# Patient Record
Sex: Female | Born: 1990 | Race: White | Hispanic: No | Marital: Married | Smoking: Never smoker
Health system: Southern US, Community
[De-identification: ages and names within clinical notes are randomized; demographics above are authoritative.]

## PROBLEM LIST (undated history)

## (undated) ENCOUNTER — Inpatient Hospital Stay (HOSPITAL_COMMUNITY): Payer: Self-pay

## (undated) DIAGNOSIS — D649 Anemia, unspecified: Secondary | ICD-10-CM

## (undated) HISTORY — PX: WISDOM TOOTH EXTRACTION: SHX21

---
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
## 2009-03-14 ENCOUNTER — Other Ambulatory Visit: Admission: RE | Admit: 2009-03-14 | Discharge: 2009-03-14 | Payer: Self-pay | Admitting: Obstetrics and Gynecology

## 2010-04-03 ENCOUNTER — Other Ambulatory Visit (HOSPITAL_COMMUNITY)
Admission: RE | Admit: 2010-04-03 | Discharge: 2010-04-03 | Disposition: A | Discharge status: Home / Self Care | Payer: 59 | Source: Ambulatory Visit | Attending: Obstetrics and Gynecology | Admitting: Obstetrics and Gynecology

## 2010-04-03 ENCOUNTER — Other Ambulatory Visit: Payer: Self-pay | Admitting: Nurse Practitioner

## 2010-04-03 DIAGNOSIS — Z113 Encounter for screening for infections with a predominantly sexual mode of transmission: Secondary | ICD-10-CM | POA: Insufficient documentation

## 2010-04-03 DIAGNOSIS — Z01419 Encounter for gynecological examination (general) (routine) without abnormal findings: Secondary | ICD-10-CM | POA: Insufficient documentation

## 2010-06-09 LAB — URINALYSIS, ROUTINE W REFLEX MICROSCOPIC
Bilirubin Urine: NEGATIVE
Ketones, ur: NEGATIVE mg/dL
Nitrite: NEGATIVE
Protein, ur: NEGATIVE mg/dL
Urobilinogen, UA: 0.2 mg/dL (ref 0.0–1.0)

## 2010-06-09 LAB — PREGNANCY, URINE: Preg Test, Ur: NEGATIVE

## 2010-06-09 LAB — GLUCOSE, CAPILLARY: Glucose-Capillary: 88 mg/dL (ref 70–99)

## 2011-04-07 ENCOUNTER — Other Ambulatory Visit: Payer: Self-pay | Admitting: Nurse Practitioner

## 2011-04-07 ENCOUNTER — Other Ambulatory Visit (HOSPITAL_COMMUNITY)
Admission: RE | Admit: 2011-04-07 | Discharge: 2011-04-07 | Disposition: A | Discharge status: Home / Self Care | Payer: 59 | Source: Ambulatory Visit | Attending: Obstetrics and Gynecology | Admitting: Obstetrics and Gynecology

## 2011-04-07 DIAGNOSIS — Z01419 Encounter for gynecological examination (general) (routine) without abnormal findings: Secondary | ICD-10-CM | POA: Insufficient documentation

## 2011-04-07 DIAGNOSIS — Z113 Encounter for screening for infections with a predominantly sexual mode of transmission: Secondary | ICD-10-CM | POA: Insufficient documentation

## 2012-05-18 ENCOUNTER — Other Ambulatory Visit: Payer: Self-pay | Admitting: Nurse Practitioner

## 2012-05-18 ENCOUNTER — Other Ambulatory Visit (HOSPITAL_COMMUNITY)
Admission: RE | Admit: 2012-05-18 | Discharge: 2012-05-18 | Disposition: A | Discharge status: Home / Self Care | Payer: 59 | Source: Ambulatory Visit | Attending: Obstetrics and Gynecology | Admitting: Obstetrics and Gynecology

## 2012-05-18 DIAGNOSIS — Z113 Encounter for screening for infections with a predominantly sexual mode of transmission: Secondary | ICD-10-CM | POA: Insufficient documentation

## 2012-05-18 DIAGNOSIS — Z01419 Encounter for gynecological examination (general) (routine) without abnormal findings: Secondary | ICD-10-CM | POA: Insufficient documentation

## 2013-08-04 LAB — OB RESULTS CONSOLE GC/CHLAMYDIA
Chlamydia: NEGATIVE
GC PROBE AMP, GENITAL: NEGATIVE

## 2013-08-04 LAB — OB RESULTS CONSOLE ANTIBODY SCREEN: ANTIBODY SCREEN: NEGATIVE

## 2013-08-04 LAB — OB RESULTS CONSOLE HIV ANTIBODY (ROUTINE TESTING): HIV: NONREACTIVE

## 2013-08-04 LAB — OB RESULTS CONSOLE ABO/RH: RH TYPE: POSITIVE

## 2013-08-04 LAB — OB RESULTS CONSOLE HEPATITIS B SURFACE ANTIGEN: HEP B S AG: NEGATIVE

## 2013-08-04 LAB — OB RESULTS CONSOLE RUBELLA ANTIBODY, IGM: RUBELLA: IMMUNE

## 2013-08-04 LAB — OB RESULTS CONSOLE RPR: RPR: NONREACTIVE

## 2013-08-14 ENCOUNTER — Other Ambulatory Visit: Payer: Self-pay | Admitting: Obstetrics & Gynecology

## 2013-08-14 ENCOUNTER — Other Ambulatory Visit (HOSPITAL_COMMUNITY)
Admission: RE | Admit: 2013-08-14 | Discharge: 2013-08-14 | Disposition: A | Discharge status: Home / Self Care | Payer: 59 | Source: Ambulatory Visit | Attending: Obstetrics & Gynecology | Admitting: Obstetrics & Gynecology

## 2013-08-14 DIAGNOSIS — Z01419 Encounter for gynecological examination (general) (routine) without abnormal findings: Secondary | ICD-10-CM | POA: Insufficient documentation

## 2013-08-14 DIAGNOSIS — Z113 Encounter for screening for infections with a predominantly sexual mode of transmission: Secondary | ICD-10-CM | POA: Insufficient documentation

## 2013-08-16 LAB — CYTOLOGY - PAP

## 2014-02-12 LAB — OB RESULTS CONSOLE GBS: GBS: NEGATIVE

## 2014-03-02 NOTE — L&D Delivery Note (Signed)
23yo G1P0@ 3529w0d who present for IOL due to postdates.  Pregnancy has been uncomplicated to date.    Induction was started with cytotec and proceeded with foley balloon and Pitocin.  She received an epidural for pain management.  She spontaneously ruptured and progressed to complete dilation. At 4:21 PM a viable female was delivered via Vaginal, Spontaneous Delivery (Presentation: Left Occiput Anterior).  APGAR: 9, 9; weight 7 lb 9.3 oz (3439 g).   Placenta status: Intact, Spontaneous.  Cord: 3 vessels with the following complications: None.  Anesthesia: Epidural  Episiotomy: None Lacerations:  Right labial Suture Repair: 3.0 vicryl and 4-0 monocryl Est. Blood Loss (mL): 300  Mom to postpartum.  Baby to Couplet care / Skin to Skin.  Myna HidalgoZAN, Ilijah Doucet, M 04/03/2014, 6:57 PM

## 2014-03-28 ENCOUNTER — Encounter (HOSPITAL_COMMUNITY): Payer: Self-pay | Admitting: *Deleted

## 2014-03-28 ENCOUNTER — Telehealth (HOSPITAL_COMMUNITY): Payer: Self-pay | Admitting: *Deleted

## 2014-03-28 NOTE — Telephone Encounter (Signed)
Preadmission screen  

## 2014-03-30 ENCOUNTER — Encounter (HOSPITAL_COMMUNITY): Payer: Self-pay | Admitting: *Deleted

## 2014-03-30 ENCOUNTER — Inpatient Hospital Stay (HOSPITAL_COMMUNITY)
Admission: AD | Admit: 2014-03-30 | Discharge: 2014-03-30 | Disposition: A | Discharge status: Home / Self Care | Payer: 59 | Source: Ambulatory Visit | Attending: Obstetrics and Gynecology | Admitting: Obstetrics and Gynecology

## 2014-03-30 DIAGNOSIS — O4693 Antepartum hemorrhage, unspecified, third trimester: Secondary | ICD-10-CM

## 2014-03-30 DIAGNOSIS — Z3A40 40 weeks gestation of pregnancy: Secondary | ICD-10-CM | POA: Insufficient documentation

## 2014-03-30 HISTORY — DX: Anemia, unspecified: D64.9

## 2014-03-30 NOTE — Discharge Instructions (Signed)
Vaginal Bleeding During Pregnancy, Third Trimester °A small amount of bleeding (spotting) from the vagina is relatively common in pregnancy. Various things can cause bleeding or spotting in pregnancy. Sometimes the bleeding is normal and is not a problem. However, bleeding during the third trimester can also be a sign of something serious for the mother and the baby. Be sure to tell your health care provider about any vaginal bleeding right away.  °Some possible causes of vaginal bleeding during the third trimester include:  °· The placenta may be partially or completely covering the opening to the cervix (placenta previa).   °· The placenta may have separated from the uterus (abruption of the placenta).   °· There may be an infection or growth on the cervix.   °· You may be starting labor, called discharging of the mucus plug.   °· The placenta may grow into the muscle layer of the uterus (placenta accreta).   °HOME CARE INSTRUCTIONS  °Watch your condition for any changes. The following actions may help to lessen any discomfort you are feeling:  °· Follow your health care provider's instructions for limiting your activity. If your health care provider orders bed rest, you may need to stay in bed and only get up to use the bathroom. However, your health care provider may allow you to continue light activity. °· If needed, make plans for someone to help with your regular activities and responsibilities while you are on bed rest. °· Keep track of the number of pads you use each day, how often you change pads, and how soaked (saturated) they are. Write this down. °· Do not use tampons. Do not douche. °· Do not have sexual intercourse or orgasms until approved by your health care provider. °· Follow your health care provider's advice about lifting, driving, and physical activities. °· If you pass any tissue from your vagina, save the tissue so you can show it to your health care provider.   °· Only take over-the-counter  or prescription medicines as directed by your health care provider. °· Do not take aspirin because it can make you bleed.   °· Keep all follow-up appointments as directed by your health care provider. °SEEK MEDICAL CARE IF: °· You have any vaginal bleeding during any part of your pregnancy. °· You have cramps or labor pains. °· You have a fever, not controlled by medicine. °SEEK IMMEDIATE MEDICAL CARE IF:  °· You have severe cramps or pain in your back or belly (abdomen). °· You have chills. °· You have a gush of fluid from the vagina. °· You pass large clots or tissue from your vagina. °· Your bleeding increases. °· You feel light-headed or weak. °· You pass out. °· You feel less movement or no movement of the baby.   °MAKE SURE YOU: °· Understand these instructions. °· Will watch your condition. °· Will get help right away if you are not doing well or get worse. °Document Released: 05/09/2002 Document Revised: 02/21/2013 Document Reviewed: 10/24/2012 °ExitCare® Patient Information ©2015 [employer]. This information is not intended to replace advice given to you by your health care provider. Make sure you discuss any questions you have with your health care provider. ° °Pelvic Rest °Pelvic rest is sometimes recommended for women when:  °· The placenta is partially or completely covering the opening of the cervix (placenta previa). °· There is bleeding between the uterine wall and the amniotic sac in the first trimester (subchorionic hemorrhage). °· The cervix begins to open without labor starting (incompetent cervix, cervical   insufficiency). °· The labor is too early (preterm labor). °HOME CARE INSTRUCTIONS °· Do not have sexual intercourse, stimulation, or an orgasm. °· Do not use tampons, douche, or put anything in the vagina. °· Do not lift anything over 10 pounds (4.5 kg). °· Avoid strenuous activity or straining your pelvic muscles. °SEEK MEDICAL CARE IF:  °· You have any vaginal bleeding during pregnancy.  Treat this as a potential emergency. °· You have cramping pain felt low in the stomach (stronger than menstrual cramps). °· You notice vaginal discharge (watery, mucus, or bloody). °· You have a low, dull backache. °· There are regular contractions or uterine tightening. °SEEK IMMEDIATE MEDICAL CARE IF: °You have vaginal bleeding and have placenta previa.  °Document Released: 06/13/2010 Document Revised: 05/11/2011 Document Reviewed: 06/13/2010 °ExitCare® Patient Information ©2015 [employer]. This information is not intended to replace advice given to you by your health care provider. Make sure you discuss any questions you have with your health care provider. ° °

## 2014-03-30 NOTE — MAU Note (Signed)
PT  SAYS SHE STARTED  VAG BLEEDING -   AT 0030.    LAST  SEX-  0030.  IN TRIAGE-   PAD ON -   LIGHT    BROWN    D/C .     ALSO    HAS UC -  STARTED   AT 930PM.   VE IN OFFICE LAST WEEK-  CLOSED.    DENIES HSV AND MRSA    GBS- NEG.

## 2014-03-30 NOTE — MAU Provider Note (Signed)
History     CSN: [id number]  Arrival date and time: 03/30/14 1324   First Provider Initiated Contact with Patient 03/30/14 0208      No chief complaint on file. vaginal bleeding  HPI Tamara [name] 24 y.o. G1P0  presents to MAU complaining of vaginal bleeding immediately following sex.  Her husband noticed the bleeding.  She went to the bathroom and kept wiping repeatedly to clean it all away.  It was noted to be bright red.  She has bled very little since then.  She is having some contractions now, worse than usual.   She is also feeling good fetal movement.  She denies LOF, dysuria, weakness, fever.    OB History    Gravida Para Term Preterm AB TAB SAB Ectopic Multiple Living   1               Past Medical History  Diagnosis Date  . Anemia     Past Surgical History  Procedure Laterality Date  . Wisdom tooth extraction      History reviewed. No pertinent family history.  History  Substance Use Topics  . Smoking status: Never Smoker   . Smokeless tobacco: Not on file  . Alcohol Use: Yes     Comment: not while pregnant    Allergies: No Known Allergies  Prescriptions prior to admission  Medication Sig Dispense Refill Last Dose  . ferrous fumarate (HEMOCYTE - 106 MG FE) 325 (106 FE) MG TABS tablet Take 1 tablet by mouth.   Past Month at Unknown time  . prenatal vitamin w/FE, FA (PRENATAL 1 + 1) 27-1 MG TABS tablet Take 1 tablet by mouth daily at 12 noon.   Past Week at Unknown time    ROS Pertinent ROS in HPI  Physical Exam   Blood pressure 138/95, pulse 104, temperature 98.4 F (36.9 C), temperature source Oral, resp. rate 20, height  (1.702 m), weight 152 lb 8 oz (69.174 kg), last menstrual period 06/20/2013.  Physical Exam  Constitutional: She is oriented to person, place, and time. She appears well-developed and well-nourished. No distress.  HENT:  Head: Normocephalic and atraumatic.  Eyes: EOM are normal.  Neck: Normal range of motion.   Cardiovascular: Normal rate and regular rhythm.   Respiratory: Effort normal and breath sounds normal. No respiratory distress.  GI: Soft. Bowel sounds are normal. She exhibits no distension. There is no tenderness. There is no rebound and no guarding.  Genitourinary:  Mod amt of dark red blood present in vault.  No active bleeding from cervix.  Cervix is closed/soft/posterior.  Musculoskeletal: Normal range of motion.  Neurological: She is alert and oriented to person, place, and time.  Skin: Skin is warm and dry.  Psychiatric: She has a normal mood and affect.   Fetal Tracing: Baseline:120s Variability:mod Accelerations: 15x15x2 Decelerations:none Toco: irregular q 3-8 minutes    MAU Course  Procedures  MDM 2:55am Discussed with Dr. Dion Body.  She advises to continue observation x 1 additional hour. 4:00am Discussed with Dr. Dion Body.  No further bleeding.  No cervical change.  Contractions have slowed but pt reports increased intensity.  No tenderness of abdomen.  MD advises okay to discharge home.   Assessment and Plan  A:  1. Vaginal bleeding in pregnancy, third trimester    P: Discharge to home Bleeding precautions Labor precautions Return if increase in pain or decrease in fetal movement Follow up with Dr. Charlotta Newton tomorrow Patient may return to MAU as needed  or if her condition were to change or worsen   Bertram Denvereague Clark, Karen E 03/30/2014, 2:09 AM

## 2014-04-02 ENCOUNTER — Encounter (HOSPITAL_COMMUNITY): Payer: Self-pay

## 2014-04-02 ENCOUNTER — Inpatient Hospital Stay (HOSPITAL_COMMUNITY)
Admission: RE | Admit: 2014-04-02 | Discharge: 2014-04-05 | DRG: 775 | Disposition: A | Discharge status: Home / Self Care | Payer: 59 | Source: Ambulatory Visit | Attending: Obstetrics & Gynecology | Admitting: Obstetrics & Gynecology

## 2014-04-02 DIAGNOSIS — O480 Post-term pregnancy: Principal | ICD-10-CM | POA: Diagnosis present

## 2014-04-02 DIAGNOSIS — Z3A40 40 weeks gestation of pregnancy: Secondary | ICD-10-CM | POA: Diagnosis present

## 2014-04-02 DIAGNOSIS — Z3490 Encounter for supervision of normal pregnancy, unspecified, unspecified trimester: Secondary | ICD-10-CM

## 2014-04-02 DIAGNOSIS — Z3403 Encounter for supervision of normal first pregnancy, third trimester: Secondary | ICD-10-CM | POA: Diagnosis present

## 2014-04-02 LAB — CBC
HCT: 35.9 % — ABNORMAL LOW (ref 36.0–46.0)
Hemoglobin: 11.7 g/dL — ABNORMAL LOW (ref 12.0–15.0)
MCH: 26.4 pg (ref 26.0–34.0)
MCHC: 32.6 g/dL (ref 30.0–36.0)
MCV: 80.9 fL (ref 78.0–100.0)
Platelets: 207 10*3/uL (ref 150–400)
RBC: 4.44 MIL/uL (ref 3.87–5.11)
RDW: 14.5 % (ref 11.5–15.5)
WBC: 10.2 10*3/uL (ref 4.0–10.5)

## 2014-04-02 MED ORDER — TERBUTALINE SULFATE 1 MG/ML IJ SOLN: 0.2500 mg | Freq: Once | INTRAMUSCULAR | Status: AC | PRN

## 2014-04-02 MED ORDER — OXYCODONE-ACETAMINOPHEN 5-325 MG PO TABS
2.0000 | ORAL_TABLET | ORAL | Status: DC | PRN
Start: 2014-04-02 — End: 2014-04-03

## 2014-04-02 MED ORDER — BUTORPHANOL TARTRATE 1 MG/ML IJ SOLN
1.0000 mg | INTRAMUSCULAR | Status: DC | PRN
  Administered 2014-04-03 (×2): 1 mg via INTRAVENOUS
  Filled 2014-04-02 (×2): qty 1

## 2014-04-02 MED ORDER — ACETAMINOPHEN 325 MG PO TABS: 650.0000 mg | ORAL_TABLET | ORAL | Status: DC | PRN

## 2014-04-02 MED ORDER — ONDANSETRON HCL 4 MG/2ML IJ SOLN
4.0000 mg | Freq: Four times a day (QID) | INTRAMUSCULAR | Status: DC | PRN
  Administered 2014-04-03: 4 mg via INTRAVENOUS
  Filled 2014-04-02: qty 2

## 2014-04-02 MED ORDER — ZOLPIDEM TARTRATE 5 MG PO TABS: 5.0000 mg | ORAL_TABLET | Freq: Once | ORAL | Status: DC

## 2014-04-02 MED ORDER — LIDOCAINE HCL (PF) 1 % IJ SOLN
30.0000 mL | INTRAMUSCULAR | Status: DC | PRN
  Filled 2014-04-02: qty 30

## 2014-04-02 MED ORDER — LACTATED RINGERS IV SOLN
INTRAVENOUS | Status: DC
  Administered 2014-04-02 – 2014-04-03 (×4): via INTRAVENOUS

## 2014-04-02 MED ORDER — MISOPROSTOL 25 MCG QUARTER TABLET
25.0000 ug | ORAL_TABLET | ORAL | Status: DC | PRN
  Administered 2014-04-02: 25 ug via VAGINAL
  Filled 2014-04-02: qty 1
  Filled 2014-04-02: qty 0.25

## 2014-04-02 MED ORDER — OXYCODONE-ACETAMINOPHEN 5-325 MG PO TABS: 1.0000 | ORAL_TABLET | ORAL | Status: DC | PRN

## 2014-04-02 MED ORDER — LACTATED RINGERS IV SOLN: 500.0000 mL | INTRAVENOUS | Status: DC | PRN

## 2014-04-02 MED ORDER — OXYTOCIN 40 UNITS IN LACTATED RINGERS INFUSION - SIMPLE MED
62.5000 mL/h | INTRAVENOUS | Status: DC
  Administered 2014-04-03: 500 mL/h via INTRAVENOUS

## 2014-04-02 MED ORDER — CITRIC ACID-SODIUM CITRATE 334-500 MG/5ML PO SOLN: 30.0000 mL | ORAL | Status: DC | PRN

## 2014-04-02 MED ORDER — OXYTOCIN BOLUS FROM INFUSION: 500.0000 mL | INTRAVENOUS | Status: DC

## 2014-04-02 NOTE — Progress Notes (Signed)
Labor Progress  Subjective: Comfortable no complaints. IOL for PD  Objective: BP 144/83 mmHg  Pulse 65  Temp(Src) 98.1 F (36.7 C) (Oral)  Resp 18  Ht 5\' 9"  (1.753 m)  Wt 153 lb (69.4 kg)  BMI 22.58 kg/m2  LMP 06/20/2013     FHT: 150, moderate variability, + accel, no decel CTX:  regular, every 4-5 minutes Uterus gravid, soft non tender SVE:  Dilation: 3 Effacement (%): 80 Station: -1 Exam by:: Willis, RN    Assessment:  IUP at 40.6 weeks NICHD: Category 1 Membranes:  intact Labor progress: IOL GBS: negative cytotec per Dr Charlotta Newtonzan  Plan: Continue labor plan Continuoust monitoring Rest Ambulate Will reassess with cervical exam at 0100 or earlier if necessary       Tamara [name], [name], MSN 04/02/2014. 10:21 PM

## 2014-04-02 NOTE — H&P (Signed)
HPI: 24 y/o G1P0 @ 8062w6d estimated gestational age (as dated by LMP c/w 20 week ultrasound) presents for scheduled IOL.  no Leaking of Fluid,   no Vaginal Bleeding,   irregular Uterine Contractions,  + Fetal Movement.  Of note, she did have an episode of vaginal bleeding this past week after IC that resolved within 24hrs, no further bleeding, FHT reassuring no evidence of active labor at that time.  ROS: no HA, no epigastric pain, no visual changes.    Pregnancy uncomplicated to date.   Prenatal Transfer Tool  Maternal Diabetes: No Genetic Screening: Normal Maternal Ultrasounds/Referrals: Normal Fetal Ultrasounds or other Referrals:  None Maternal Substance Abuse:  No Significant Maternal Medications:  None Significant Maternal Lab Results: None   PNL:  GBS negative, Rub Immune, Hep B neg, RPR NR, HIV neg, GC/C neg, glucola:normal H/H: 12.7, (08/17/13) Pap, GC/C neg  Blood type: B positive  Immunizations:  Tdap given (12/26/13) Flu given (12/04/13)  OBHx: primip PMHx:  none Meds:  PNV Allergy:  No Known Allergies SurgHx: no SocHx:   no Tobacco, no  EtOH, no Illicit Drugs  O: LMP 06/20/2013  Examination performed in office (03/29/14) Gen. AAOx3, NAD CV.  RRR  No murmur.  Resp. CTAB, no wheeze or crackles. Abd. Gravid,  no tenderness,  no rigidity,  no guarding Extr.  no edema B/L , no calf tenderness  FHT: 145 by doppler SVE: closed/25/-3, soft   Labs: see orders  A/P:  24 y.o. G1P0 @ 7562w6d EGA who presents for IOL due to postdate -FWB:  reassuring -IOL: Induction to be started with cytotec -GBS: negative -Pain management: Stadol IV as needed  Myna HidalgoJennifer Leani Myron, DO [phone] (pager) [phone] (office)

## 2014-04-03 ENCOUNTER — Inpatient Hospital Stay (HOSPITAL_COMMUNITY): Payer: 59 | Admitting: Anesthesiology

## 2014-04-03 ENCOUNTER — Encounter (HOSPITAL_COMMUNITY): Payer: Self-pay

## 2014-04-03 LAB — TYPE AND SCREEN
ABO/RH(D): B POS
ANTIBODY SCREEN: NEGATIVE

## 2014-04-03 LAB — ABO/RH: ABO/RH(D): B POS

## 2014-04-03 MED ORDER — OXYCODONE-ACETAMINOPHEN 5-325 MG PO TABS: 2.0000 | ORAL_TABLET | ORAL | Status: DC | PRN

## 2014-04-03 MED ORDER — OXYTOCIN 40 UNITS IN LACTATED RINGERS INFUSION - SIMPLE MED: 1.0000 m[IU]/min | INTRAVENOUS | Status: DC

## 2014-04-03 MED ORDER — ONDANSETRON HCL 4 MG PO TABS: 4.0000 mg | ORAL_TABLET | ORAL | Status: DC | PRN

## 2014-04-03 MED ORDER — ONDANSETRON HCL 4 MG/2ML IJ SOLN: 4.0000 mg | INTRAMUSCULAR | Status: DC | PRN

## 2014-04-03 MED ORDER — IBUPROFEN 600 MG PO TABS
600.0000 mg | ORAL_TABLET | Freq: Four times a day (QID) | ORAL | Status: DC
  Administered 2014-04-04 – 2014-04-05 (×7): 600 mg via ORAL
  Filled 2014-04-03 (×8): qty 1

## 2014-04-03 MED ORDER — LANOLIN HYDROUS EX OINT: TOPICAL_OINTMENT | CUTANEOUS | Status: DC | PRN

## 2014-04-03 MED ORDER — EPHEDRINE 5 MG/ML INJ
10.0000 mg | INTRAVENOUS | Status: DC | PRN
  Filled 2014-04-03: qty 2

## 2014-04-03 MED ORDER — DIPHENHYDRAMINE HCL 50 MG/ML IJ SOLN: 12.5000 mg | INTRAMUSCULAR | Status: DC | PRN

## 2014-04-03 MED ORDER — PHENYLEPHRINE 40 MCG/ML (10ML) SYRINGE FOR IV PUSH (FOR BLOOD PRESSURE SUPPORT)
PREFILLED_SYRINGE | INTRAVENOUS | Status: AC
  Filled 2014-04-03: qty 20

## 2014-04-03 MED ORDER — OXYTOCIN 40 UNITS IN LACTATED RINGERS INFUSION - SIMPLE MED: 62.5000 mL/h | INTRAVENOUS | Status: DC | PRN

## 2014-04-03 MED ORDER — LIDOCAINE HCL (PF) 1 % IJ SOLN
INTRAMUSCULAR | Status: DC | PRN
  Administered 2014-04-03: 9 mL
  Administered 2014-04-03: 6 mL

## 2014-04-03 MED ORDER — TERBUTALINE SULFATE 1 MG/ML IJ SOLN
0.2500 mg | Freq: Once | INTRAMUSCULAR | Status: DC | PRN
  Filled 2014-04-03: qty 1

## 2014-04-03 MED ORDER — ZOLPIDEM TARTRATE 5 MG PO TABS: 5.0000 mg | ORAL_TABLET | Freq: Every evening | ORAL | Status: DC | PRN

## 2014-04-03 MED ORDER — OXYCODONE-ACETAMINOPHEN 5-325 MG PO TABS: 1.0000 | ORAL_TABLET | ORAL | Status: DC | PRN

## 2014-04-03 MED ORDER — PRENATAL MULTIVITAMIN CH
1.0000 | ORAL_TABLET | Freq: Every day | ORAL | Status: DC
  Administered 2014-04-04 – 2014-04-05 (×2): 1 via ORAL
  Filled 2014-04-03 (×2): qty 1

## 2014-04-03 MED ORDER — PHENYLEPHRINE 40 MCG/ML (10ML) SYRINGE FOR IV PUSH (FOR BLOOD PRESSURE SUPPORT)
80.0000 ug | PREFILLED_SYRINGE | INTRAVENOUS | Status: DC | PRN
Start: 2014-04-03 — End: 2014-04-03
  Filled 2014-04-03: qty 2

## 2014-04-03 MED ORDER — DIPHENHYDRAMINE HCL 25 MG PO CAPS
25.0000 mg | ORAL_CAPSULE | Freq: Four times a day (QID) | ORAL | Status: DC | PRN
Start: 2014-04-03 — End: 2014-04-05

## 2014-04-03 MED ORDER — LACTATED RINGERS IV SOLN
500.0000 mL | Freq: Once | INTRAVENOUS | Status: AC
  Administered 2014-04-03: 500 mL via INTRAVENOUS

## 2014-04-03 MED ORDER — SENNOSIDES-DOCUSATE SODIUM 8.6-50 MG PO TABS
2.0000 | ORAL_TABLET | ORAL | Status: DC
  Administered 2014-04-04 (×2): 2 via ORAL
  Filled 2014-04-03 (×2): qty 2

## 2014-04-03 MED ORDER — FENTANYL 2.5 MCG/ML BUPIVACAINE 1/10 % EPIDURAL INFUSION (WH - ANES): 14.0000 mL/h | INTRAMUSCULAR | Status: DC | PRN

## 2014-04-03 MED ORDER — BENZOCAINE-MENTHOL 20-0.5 % EX AERO
1.0000 "application " | INHALATION_SPRAY | CUTANEOUS | Status: DC | PRN
  Administered 2014-04-03: 1 via TOPICAL
  Filled 2014-04-03: qty 56

## 2014-04-03 MED ORDER — FENTANYL 2.5 MCG/ML BUPIVACAINE 1/10 % EPIDURAL INFUSION (WH - ANES)
INTRAMUSCULAR | Status: DC | PRN
  Administered 2014-04-03: 14 mL/h via EPIDURAL

## 2014-04-03 MED ORDER — PHENYLEPHRINE 40 MCG/ML (10ML) SYRINGE FOR IV PUSH (FOR BLOOD PRESSURE SUPPORT)
80.0000 ug | PREFILLED_SYRINGE | INTRAVENOUS | Status: DC | PRN
  Filled 2014-04-03: qty 2

## 2014-04-03 MED ORDER — SIMETHICONE 80 MG PO CHEW: 80.0000 mg | CHEWABLE_TABLET | ORAL | Status: DC | PRN

## 2014-04-03 MED ORDER — OXYTOCIN 40 UNITS IN LACTATED RINGERS INFUSION - SIMPLE MED
1.0000 m[IU]/min | INTRAVENOUS | Status: DC
  Administered 2014-04-03: 1 m[IU]/min via INTRAVENOUS
  Filled 2014-04-03: qty 1000

## 2014-04-03 MED ORDER — DIBUCAINE 1 % RE OINT
1.0000 "application " | TOPICAL_OINTMENT | RECTAL | Status: DC | PRN
  Administered 2014-04-04: 1 via RECTAL
  Filled 2014-04-03: qty 28

## 2014-04-03 MED ORDER — WITCH HAZEL-GLYCERIN EX PADS: 1.0000 "application " | MEDICATED_PAD | CUTANEOUS | Status: DC | PRN

## 2014-04-03 MED ORDER — FENTANYL 2.5 MCG/ML BUPIVACAINE 1/10 % EPIDURAL INFUSION (WH - ANES)
INTRAMUSCULAR | Status: AC
  Filled 2014-04-03: qty 125

## 2014-04-03 NOTE — Progress Notes (Signed)
OB PN:  S: Resting comfortably with epidural  Objective: BP 121/62 mmHg  Pulse 58  Temp(Src) 98 F (36.7 C) (Oral)  Resp 18  Ht 5\' 9"  (1.753 m)  Wt 69.4 kg (153 lb)  BMI 22.58 kg/m2  SpO2 99%  LMP 06/20/2013     FHT: 125, moderate variability, + accel, no decels CTX:  q3-565min SVE: 4/80/-3, ballotable  CBC    Component Value Date/Time   WBC 10.2 04/02/2014 2010   RBC 4.44 04/02/2014 2010   HGB 11.7* 04/02/2014 2010   HCT 35.9* 04/02/2014 2010   PLT 207 04/02/2014 2010   MCV 80.9 04/02/2014 2010   MCH 26.4 04/02/2014 2010   MCHC 32.6 04/02/2014 2010   RDW 14.5 04/02/2014 2010   A/P: 23yo G1P0@[redacted]w[redacted]d  who presents for IOL for postdates. -FWB- Cat. I -Labor: continue with Pit per protocol -will plan for AROM when head well engaged -GBS negative -continue epidural  Tamara HidalgoJennifer Mercades Bajaj, DO [phone] (pager) [phone] (office)

## 2014-04-03 NOTE — Progress Notes (Signed)
OB PN:  S: Patient resting comfortably, reports minimal discomfort.  Objective: BP 131/77 mmHg  Pulse 59  Temp(Src) 98 F (36.7 C) (Oral)  Resp 18  Ht 5\' 9"  (1.753 m)  Wt 69.4 kg (153 lb)  BMI 22.58 kg/m2  LMP 06/20/2013     FHT: 125, moderate variability, + accel, no decels CTX:  q3-295min SVE: Deferred, Foley in place, last exam per Venus Standard [name]- 2/70/-3 @ 0100  CBC    Component Value Date/Time   WBC 10.2 04/02/2014 2010   RBC 4.44 04/02/2014 2010   HGB 11.7* 04/02/2014 2010   HCT 35.9* 04/02/2014 2010   PLT 207 04/02/2014 2010   MCV 80.9 04/02/2014 2010   MCH 26.4 04/02/2014 2010   MCHC 32.6 04/02/2014 2010   RDW 14.5 04/02/2014 2010   A/P: 23yo G1P0@[redacted]w[redacted]d  who presents for IOL for postdates. -FWB- Cat. I -Labor: Foley in place, will continue with balloon, will start low dose Pitocin today -ok for shower and breakfast this am -GBS negative -Pain management: Stadol prn, ok for epidural if requested  Myna HidalgoJennifer Shantika Bermea, DO [phone] (pager) [phone] (office)

## 2014-04-03 NOTE — Progress Notes (Signed)
Labor Progress  Subjective: Starting to feel the ctx.  Reviewed pain management options  Objective: BP 144/83 mmHg  Pulse 65  Temp(Src) 98.1 F (36.7 C) (Oral)  Resp 18  Ht 5\' 9"  (1.753 m)  Wt 153 lb (69.4 kg)  BMI 22.58 kg/m2  LMP 06/20/2013     FHT: 135, moderate variability, + accel, no decel CTX:  regular, every 3-4 minutes Uterus gravid, soft non tender SVE:  2/70/-3   Assessment:  IUP at 41.0 weeks NICHD: Category Membranes:  intact Labor progress:IOL GBS: negative Foley bulb placed, pt tolerated well   Plan: Continue labor plan Continuous monitoring Rest/Ambulate Will reassess with cervical exam at 5:00 or earlier if necessary IV pain med per pt rquest       Karielle Davidow, [name], MSN 04/03/2014. 1:19 AM

## 2014-04-03 NOTE — Progress Notes (Signed)
Labor Progress  Subjective: Pt sleeping.  Objective: BP 131/77 mmHg  Pulse 59  Temp(Src) 98 F (36.7 C) (Oral)  Resp 18  Ht 5\' 9"  (1.753 m)  Wt 153 lb (69.4 kg)  BMI 22.58 kg/m2  LMP 06/20/2013     FHT: 120 moderate variability, + accel, no decel CTX:  regular, every 5 minutes Uterus gravid, soft non tender SVE:  Dilation: 2 Effacement (%): 70 Station: -3 Exam by:: Madora Barletta, [name]    Assessment:  IUP at 41.0 weeks NICHD: Category 1 Membranes:  intact Labor progress: IOL for PD ONG:[id number]:negative Foley bulb still in place   Plan: Continue labor plan Continuous monitoring Will reassess with cervical exam at 0700 or earlier if necessary       Offie Pickron, [name], MSN 04/03/2014. 5:27 AM

## 2014-04-03 NOTE — Anesthesia Procedure Notes (Signed)
Epidural Patient location during procedure: OB Start time: 04/03/2014 11:42 AM End time: 04/03/2014 11:46 AM  Staffing Anesthesiologist: Leilani AbleHATCHETT, Ilo Beamon Performed by: anesthesiologist   Preanesthetic Checklist Completed: patient identified, surgical consent, pre-op evaluation, timeout performed, IV checked, risks and benefits discussed and monitors and equipment checked  Epidural Patient position: sitting Prep: site prepped and draped and DuraPrep Patient monitoring: continuous pulse ox and blood pressure Approach: midline Location: L3-L4 Injection technique: LOR air  Needle:  Needle type: Tuohy  Needle gauge: 17 G Needle length: 9 cm and 9 Needle insertion depth: 5 cm cm Catheter type: closed end flexible Catheter size: 19 Gauge Catheter at skin depth: 10 cm Test dose: negative and Other  Assessment Sensory level: T9 Events: blood not aspirated, injection not painful, no injection resistance, negative IV test and no paresthesia

## 2014-04-03 NOTE — Anesthesia Preprocedure Evaluation (Signed)

## 2014-04-03 NOTE — Progress Notes (Signed)
Labor Progress  Subjective: Up watching TV.  States the ctx feel like they have spaced out.  Objective: BP 131/77 mmHg  Pulse 59  Temp(Src) 98 F (36.7 C) (Oral)  Resp 18  Ht 5\' 9"  (1.753 m)  Wt 153 lb (69.4 kg)  BMI 22.58 kg/m2  LMP 06/20/2013     FHT: 125, moderate variability, + accel, no decels CTX:  regular, every 4-5 minutes Uterus gravid, soft non tender SVE:  Dilation: 2 Effacement (%): 70 Station: -3 Exam by:: Tamara [name], [name]    Assessment:  IUP at 41.0 weeks NICHD: Category 1 Membranes: intact Labor progress:IOL for PD AOZ:[id number]:negative   Plan: Continue labor plan Continuous monitoring Rest/Ambulate Frequent position changes to facilitate fetal rotation and descent. Will give Dr Charlotta Newtonzan report  Start low dose pitocin     Tamara [name], [name], MSN 04/03/2014. 6:23 AM

## 2014-04-04 LAB — CBC
HCT: 28.3 % — ABNORMAL LOW (ref 36.0–46.0)
HEMOGLOBIN: 9.3 g/dL — AB (ref 12.0–15.0)
MCH: 26.7 pg (ref 26.0–34.0)
MCHC: 32.9 g/dL (ref 30.0–36.0)
MCV: 81.3 fL (ref 78.0–100.0)
Platelets: 163 10*3/uL (ref 150–400)
RBC: 3.48 MIL/uL — AB (ref 3.87–5.11)
RDW: 14.8 % (ref 11.5–15.5)
WBC: 13.9 10*3/uL — ABNORMAL HIGH (ref 4.0–10.5)

## 2014-04-04 LAB — HIV ANTIBODY (ROUTINE TESTING W REFLEX): HIV Screen 4th Generation wRfx: NONREACTIVE

## 2014-04-04 LAB — RPR: RPR Ser Ql: NONREACTIVE

## 2014-04-04 NOTE — Anesthesia Postprocedure Evaluation (Signed)
  Anesthesia Post-op Note  Patient: Tamara [name]  Procedure(s) Performed: * No procedures listed *  Patient Location: Mother/Baby  Anesthesia Type:Epidural  Level of Consciousness: awake, alert , oriented and patient cooperative  Airway and Oxygen Therapy: Patient Spontanous Breathing  Post-op Pain: mild  Post-op Assessment: Post-op Vital signs reviewed, Patient's Cardiovascular Status Stable, Respiratory Function Stable, Patent Airway, No headache, No backache, No residual numbness and No residual motor weakness  Post-op Vital Signs: Reviewed and stable  Last Vitals:  Filed Vitals:   04/04/14 0606  BP: 124/72  Pulse: 69  Temp: 36.8 C  Resp: 16    Complications: No apparent anesthesia complications

## 2014-04-04 NOTE — Lactation Note (Signed)
This note was copied from the chart of Tamara [name]. Lactation Consultation Note  Patient Name: Tamara [name] Initial Assessment: RN started Mom using #20 nipple shield due to nipple pain with nursing. Flat, short shaft nipples with aerola edema present bilateral with some redness, bruising. Baby has short anterior lingunal frenulum. With suck exam, baby humps his tongue, thrusts his tongue with nursing. Assisted Mom with latching baby using 1st #16 nipple shield which appeared to fit better however this was uncomfortable for Mom. Changed to the #20 nipple shield, Mom had pain with initial latch but this improved with the baby nursing. Colostrum present in the nipple shield with nursing both breasts.  RN set up DEBP for Mom to use and she had previously pumped 4 ml of colostrum. Demonstrated to parents how to finger feed this back to the baby using curved tipped syringe. Encouraged Mom to post pump on Preemie setting after BF to encourage milk production and give baby back any colostrum she receives. Reviewed cleaning pump pieces. Care for sore nipples reviewed, comfort gels given with instructions. Advised to call for assist as needed with feedings.    Maternal Data    Feeding Feeding Type: Breast Milk Length of feed: 20 min  LATCH Score/Interventions Latch: Grasps breast easily, tongue down, lips flanged, rhythmical sucking. (using #20 nipple shield) Intervention(s): Skin to skin;Teach feeding cues Intervention(s): Assist with latch  Audible Swallowing: A few with stimulation Intervention(s): Hand expression;Skin to skin  Type of Nipple: Flat  Comfort (Breast/Nipple): Filling, red/small blisters or bruises, mild/mod discomfort (short shaft, aerola edema bilateral)  Problem noted: Mild/Moderate discomfort (bruising bilateral) Interventions (Mild/moderate discomfort): Post-pump;Comfort gels;Hand massage;Hand expression  Hold (Positioning):  Assistance needed to correctly position infant at breast and maintain latch. Intervention(s): Breastfeeding basics reviewed;Support Pillows;Position options;Skin to skin  LATCH Score: 6  Lactation Tools Discussed/Used Tools: Nipple Shields;Pump;Comfort gels Nipple shield size: 20;16 Breast pump type: Double-Electric Breast Pump WIC Program: No   Consult Status Consult Status: Follow-up [name]: 04/05/14 Follow-up type: In-patient    Alfred LevinsGranger, Ermias Tomeo Ann [name], 2:20 PM

## 2014-04-04 NOTE — Progress Notes (Signed)
Postpartum Note Day # 1  S:  Patient resting comfortable in bed.  Pain controlled.  Tolerating general. + flatus, no BM.  Lochia moderate.  Ambulating without difficulty.  She denies n/v/f/c, SOB, or CP.  Pt plans on breastfeeding.  O: Temp:  [98 F (36.7 C)-99 F (37.2 C)] 98.2 F (36.8 C) (02/03 0606) Pulse Rate:  [57-90] 69 (02/03 0606) Resp:  [16-20] 16 (02/03 0606) BP: (107-149)/(59-101) 124/72 mmHg (02/03 0606) SpO2:  [99 %-100 %] 100 % (02/02 1216) Gen: A&Ox3, NAD CV: RRR, no MRG Resp: CTAB Abdomen: soft, NT, ND Uterus: firm, non-tender, below umbilicus Ext: No edema, no calf tenderness bilaterally  Labs:  Recent Labs  04/02/14 2010 04/04/14 0545  HGB 11.7* 9.3*    A/P: Pt is a 24 y.o. G1P1001 s/p NSVD, PPD#1  - Pain well controlled -GU: UOP is adequate -GI: Tolerating general diet -Activity: encouraged sitting up to chair and ambulation as tolerated -Prophylaxis: encourage early ambulation -Labs: stable as above -baby boy circ to be performed later today, consent obtained  DISPO: continue with postpartum care, plan for discharge on 04/05/14  Myna HidalgoJennifer Adrianna Dudas, DO [phone] (pager) [phone] (office)

## 2014-04-05 MED ORDER — IBUPROFEN 600 MG PO TABS: 600.0000 mg | ORAL_TABLET | Freq: Four times a day (QID) | ORAL | Status: DC

## 2014-04-05 NOTE — Lactation Note (Signed)
This note was copied from the chart of [place]. Lactation Consultation Note  Patient Name: Boy Adryan Druckenmiller [id number] Date: 04/05/2014  Called into mom's room just prior to D/C to assist with moisture in DEBP tubing. Assisted with running pump and drying out tubing. Mom states that she called and made an OP appointment for Monday, so gave appointment sheet guidelines. Reviewed engorgement prevention/treatment and enc to bring pump kit to appointment. Mom states she has personal pump at home.   Maternal Data    Feeding Feeding Type: Bottle Fed - Formula Nipple Type: Slow - flow  LATCH Score/Interventions                      Lactation Tools Discussed/Used     Consult Status      Maurene Hollin 04/05/2014, 12:00 PM

## 2014-04-05 NOTE — Progress Notes (Signed)
Postpartum Note Day # 2  S:  Patient resting comfortable in bed.  Upon our discussion, she became tearful as she is upset about her difficulty with breastfeeding.  Pain well controlled.  Tolerating general. + flatus, no BM.  Lochia moderate.  Ambulating without difficulty.  She denies n/v/f/c, SOB, or CP.  Pt plans on breastfeeding and supplementing with formula.  O: Temp:  [97.4 F (36.3 C)-98.1 F (36.7 C)] 97.4 F (36.3 C) (02/04 0505) Pulse Rate:  [76-87] 87 (02/04 0505) Resp:  [18] 18 (02/04 0505) BP: (122-124)/(77-82) 124/77 mmHg (02/04 0505) Gen: A&Ox3, appears fatigued CV: RRR, no MRG Resp: CTAB Abdomen: soft, NT, ND Uterus: firm, non-tender, below umbilicus Ext: No edema, no calf tenderness bilaterally  Labs:   Recent Labs  04/02/14 2010 04/04/14 0545  HGB 11.7* 9.3*    A/P: Pt is a 24 y.o. G1P1001 s/p NSVD, PPD#2  - Pain well controlled -GU: Voiding freely -GI: Tolerating general diet -Activity: encouraged sitting up to chair and ambulation as tolerated -Prophylaxis: encourage early ambulation -Labs: stable as above -Baby bory circ performed on 04/04/14 -Pysch: Denies SI/HI.  Concern for postpartum blues, pt to f/u in 2 weeks.  Reviewed postpartum depression and encouraged pt to call the office immediately if she needed support.  DISPO: Plan for discharge home today  Myna HidalgoJennifer Enrica Corliss, [place] [phone] (pager) [phone] (office)

## 2014-04-05 NOTE — Discharge Instructions (Signed)

## 2014-04-05 NOTE — Lactation Note (Signed)
This note was copied from the chart of Tamara [name]. Lactation Consultation Note Mom having difficulty w/painful latches. Using #20NS, has #16NS. Has tiny small short shaft nipples and puffy/bouncy areolas. #20NS some areola everts into NS. Encouraged to use #16NS. Gave shells to assist in everting nipples more. Breast feel heavy. Mom has some edema to LE. Nipples are not centered in areola and pointing towards mid sternum. Lt. Areola I noticed large moveable lump, not sure if its a milk gland? Mom has personal DEBP, knows how to use and clean. Mom tearful d/t nipple pain and supplementing. Asked if she could just pump and bottle feed. I stated that was fine, It was her choice how she fed her baby. Baby has limited tongue mobility, high palate, and upper lip labial frenulum. Expecting most of nipple pain. Mom has colostrum that she pumped, but not enough to just pump and fed at this time. understands that hand expression she can get more out and give to baby. Supplementing as well until milk comes in. Encouraged mom to talk to Ped. MD to assess baby's mouth and discuss plan. BF may be much better if frenulum fixed. Discussed supply and demand, pumping, milk storage, I&O. Encouraged  f/u needed w/LC out pt. Services. Patient Name: Tamara [name] ZOXWR'UToday's Date: 04/05/2014 Reason for consult: Breast/nipple pain   Maternal Data Has patient been taught Hand Expression?: Yes  Feeding Feeding Type: Formula Nipple Type: Slow - flow  LATCH Score/Interventions       Type of Nipple: Everted at rest and after stimulation (small everted/short shaft nipples) Intervention(s): Shells;Double electric pump;Reverse pressure  Comfort (Breast/Nipple): Filling, red/small blisters or bruises, mild/mod discomfort  Problem noted: Mild/Moderate discomfort Interventions (Mild/moderate discomfort): Comfort gels;Breast shields;Post-pump;Reverse pressue;Hand expression;Hand massage  Intervention(s):  Breastfeeding basics reviewed;Position options     Lactation Tools Discussed/Used Tools: Shells;Nipple Shields;Pump;Comfort gels;Bottle Nipple shield size: 16 Shell Type: Inverted Breast pump type: Double-Electric Breast Pump Pump Review: Setup, frequency, and cleaning;Milk Storage Initiated by:: RN/L. Tzion Wedel RN Date initiated:: 04/05/14   Consult Status Consult Status: Complete Date: 04/05/14 Follow-up type: Call as needed    Charyl DancerCARVER, Jalah Warmuth G 04/05/2014, 7:37 AM

## 2014-04-07 ENCOUNTER — Inpatient Hospital Stay (HOSPITAL_COMMUNITY)
Admission: AD | Admit: 2014-04-07 | Discharge: 2014-04-07 | Disposition: A | Discharge status: Home / Self Care | Payer: 59 | Source: Ambulatory Visit | Attending: Obstetrics & Gynecology | Admitting: Obstetrics & Gynecology

## 2014-04-07 ENCOUNTER — Encounter (HOSPITAL_COMMUNITY): Payer: Self-pay | Admitting: *Deleted

## 2014-04-07 DIAGNOSIS — N644 Mastodynia: Secondary | ICD-10-CM | POA: Diagnosis present

## 2014-04-07 DIAGNOSIS — O9279 Other disorders of lactation: Secondary | ICD-10-CM

## 2014-04-07 NOTE — Lactation Note (Addendum)
Lactation Consultation Note  Patient Name: Tamara [name] ZOXWR'UToday's Date: 04/07/2014    Primip postpartum presents w/L breast pain.  Mom does not put baby to the breast; she only pumps and BO.   R breast is full; L breast is engorged.  Areolar edema notable on both breasts, but especially the L breast. Mom's flange size on R breast was increased to a size 27; the flange on the L breast was increased to a size 30.  Mom noted increased comfort w/flange changes.   Mom no longer wants to continue lactating.   Mom made aware that she cannot quit "cold [place]" as she could get mastitis. The following plan was provided: 1. Sage tea. 2. Cabbage leaves; crush the veins and surround breasts.  Change leaves as they wilt.   Wear around the clock as tolerated.  3. Pump as needed for comfort (as the areola swelling decreases, she may be able to go down a flange size).  4. Consider pseudoephedrine.   Mom has NKDA.   [name], [name] [facility] 04/07/2014, 4:45 PM

## 2014-04-07 NOTE — Discharge Instructions (Signed)
Breast Engorgement  Breast engorgement is the overfilling of your breasts with breast milk. In the first few weeks after giving birth, you may experience breast engorgement. Although it is normal for your breasts to feel heavy, full, and uncomfortable within 3-5 days of giving birth, breast engorgement can make your breasts throb and feel hard, tightly stretched, warm, and tender. Engorgement peaks about the fifth day after you give birth. Breast engorgement can be easily treated and does not require you to stop breastfeeding.  CAUSES OF BREAST ENGORGEMENT Some women delay feedings because of sore or cracked nipples, which can lead to engorgement. Cracked and sore nipples often are caused by inadequate latching (when your baby's mouth attaches to your breast to breastfeed). If your baby is latched on properly, he or she should be able to breastfeed as long as needed, without causing any pain. If you do feel pain while breastfeeding, take your baby off your breast and try again. Get help from your health care provider or a lactation consultant if you continue to have pain. Other causes of engorgement include:   Improper position of your baby while breastfeeding.  Allowing too much time to pass between feedings.  Reduction in breastfeeding because you give your baby water, juice, formula, breast milk from a bottle, or a pacifier instead of breastfeeding.  Changes in your baby's feeding patterns.  Weak sucking from your baby, which causes less milk to be taken out of your breast during feedings.  Fatigue, stress, anemia.  Plugged milk ducts.  A history of breast surgery. SIGNS AND SYMPTOMS OF BREAST ENGORGEMENT If your breasts become engorged, you may experience:   Breast swelling, tenderness, warmth, redness, or throbbing.  Breast hardness and stretching of the skin around your breast.  Flattening, tightening, and hardening of your nipple.  A low-grade fever, which can be confused with a  breast infection. RECOMMENDATIONS TO EASE BREAST ENGORGEMENT Breast engorgement should improve in 24-48 hours after following these recommendations:  Breastfeed when you feel the need to reduce the fullness of your breasts or when your baby shows signs of hunger. This is called "breastfeeding on demand."  Newborns (babies younger than 4 weeks) often breastfeed every 1-3 hours during the day. You may need to awaken your baby to feed if he or she is asleep at a feeding time.  Do not allow your baby to sleep longer than 5 hours during the night without a feeding.  Pump or hand-express breast milk before breastfeeding to soften your breast, areola, and nipple.   Apply warm, moist heat (in the shower or with warm water-soaked hand towels) just before feeding or pumping, or massage your breast before or during breastfeeding. This increases circulation and helps your milk to flow.  Completely empty your breasts when breastfeeding or pumping. Afterward, wear a snug bra (nursing or regular) or tank top for 1-2 days to signal your body to slightly decrease milk production. Only wear snug bras or tank tops to treat engorgement. Tight bras typically should be avoided by breastfeeding mothers. Once engorgement is relieved, return to wearing regular, loose-fitting clothes.  Apply ice packs to your breasts to lessen the pain from engorgement and relieve swelling, unless the ice is uncomfortable for you.  Do not delay feedings. Try to relax when it is time to feed your baby. This helps to trigger your "let-down reflex," which releases milk from your breast.  Ensure your baby is latched on to your breast and positioned properly while breastfeeding.  Allow your baby to remain at your breast as long as he or she is latched on well and actively sucking. Your baby will let you know when he or she is done breastfeeding by pulling away from your breast or falling asleep.  Avoid introducing bottles or pacifiers  to your baby in the early weeks of breastfeeding. Wait to introduce these things until after resolving any breastfeeding challenges.  Try to pump your milk on the same schedule as when your baby would breastfeed if you are returning to work or away from home for an extended period.  Drink plenty of fluids to avoid dehydration, which can eventually put you at greater risk of breast engorgement.  CALL YOUR HEALTH CARE PROVIDER OR LACTATION CONSULTANT IF:   Engorgement lasts longer than 2 days, even after treatment.  You have flu-like symptoms, such as a fever, chills, or body aches.  Your breasts become increasingly red and painful. Document Released: 06/13/2004 Document Revised: 10/19/2012 Document Reviewed: 08/11/2012 [facility] Patient Information 2015 [place], [place]. This information is not intended to replace advice given to you by your health care provider. Make sure you discuss any questions you have with your health care provider.  Breastfeeding Challenges and Solutions Even though breastfeeding is natural, it can be challenging, especially in the first few weeks after childbirth. It is normal for problems to arise when starting to breastfeed your new baby, even if you have breastfed before. This document provides some solutions to the most common breastfeeding challenges.  CHALLENGES AND SOLUTIONS Challenge--Cracked or Sore Nipples Cracked or sore nipples are commonly experienced by breastfeeding mothers. Cracked or sore nipples often are caused by inadequate latching (when your baby's mouth attaches to your breast to breastfeed). Soreness can also happen if your baby is not positioned properly at your breast. Although nipple cracking and soreness are common during the first week after birth, nipple pain is never normal. If you experience nipple cracking or soreness that lasts longer than 1 week or nipple pain, call your health care provider or lactation consultant.  Solution Ensure  proper latching and positioning of your baby by following the steps below:  Find a comfortable place to sit or lie down, with your neck and back well supported.  Place a pillow or rolled up blanket under your baby to bring him or her to the level of your breast (if you are seated).  Make sure that your baby's abdomen is facing your abdomen.  Gently massage your breast. With your fingertips, massage from your chest wall toward your nipple in a circular motion. This encourages milk flow. You may need to continue this action during the feeding if your milk flows slowly.  Support your breast with 4 fingers underneath and your thumb above your nipple. Make sure your fingers are well away from your nipple and your baby's mouth.  Stroke your baby's lips gently with your finger or nipple.  When your baby's mouth is open wide enough, quickly bring your baby to your breast, placing your entire nipple and as much of the colored area around your nipple (areola) as possible into your baby's mouth.  More areola should be visible above your baby's upper lip than below the lower lip.  Your baby's tongue should be between his or her lower gum and your breast.  Ensure that your baby's mouth is correctly positioned around your nipple (latched). Your baby's lips should create a seal on your breast and be turned out (everted).  It is common  for your baby to suck for about 2-3 minutes in order to start the flow of breast milk. Signs that your baby has successfully latched on to your nipple include:   Quietly tugging or quietly sucking without causing you pain.   Swallowing heard between every 3-4 sucks.   Muscle movement above and in front of his or her ears with sucking.  Signs that your baby has not successfully latched on to nipple include:   Sucking sounds or smacking sounds from your baby while nursing.   Nipple pain.  Ensure that your breasts stay moisturized and healthy by:  Avoiding the  use of soap on your nipples.   Wearing a supportive bra. Avoid wearing underwire-style bras or tight bras.   Air drying your nipples for 3-4 minutes after each feeding.   Using only cotton bra pads to absorb breast milk leakage. Leaking of breast milk between feedings is normal. Be sure to change the pads if they become soaked with milk.  Using lanolin on your nipples after nursing. Lanolin helps to maintain your skin's normal moisture barrier. If you use pure lanolin you do not need to wash it off before feeding your baby again. Pure lanolin is not toxic to your baby. You may also hand express a few drops of breast milk and gently massage that milk into your nipples, allowing it to air dry. Challenge--Breast Engorgement Breast engorgement is the overfilling of your breasts with breast milk. In the first few weeks after giving birth, you may experience breast engorgement. Breast engorgement can make your breasts throb and feel hard, tightly stretched, warm, and tender. Engorgement peaks about the fifth day after you give birth. Having breast engorgement does not mean you have to stop breastfeeding your baby. Solution  Breastfeed when you feel the need to reduce the fullness of your breasts or when your baby shows signs of hunger. This is called "breastfeeding on demand."  Newborns (babies younger than 4 weeks) often breastfeed every 1-3 hours during the day. You may need to awaken your baby to feed if he or she is asleep at a feeding time.  Do not allow your baby to sleep longer than 5 hours during the night without a feeding.  Pump or hand express breast milk before breastfeeding to soften your breast, areola, and nipple.  Apply warm, moist heat (in the shower or with warm water-soaked hand towels) just before feeding or pumping, or massage your breast before or during breastfeeding. This increases circulation and helps your milk to flow.  Completely empty your breasts when breastfeeding  or pumping. Afterward, wear a snug bra (nursing or regular) or tank top for 1-2 days to signal your body to slightly decrease milk production. Only wear snug bras or tank tops to treat engorgement. Tight bras typically should be avoided by breastfeeding mothers. Once engorgement is relieved, return to wearing regular, loose-fitting clothes.  Apply ice packs to your breasts to lessen the pain from engorgement and relieve swelling, unless the ice is uncomfortable for you.  Do not delay feedings. Try to relax when it is time to feed your baby. This helps to trigger your "let-down reflex," which releases milk from your breast.  Ensure your baby is latched on to your breast and positioned properly while breastfeeding.  Allow your baby to remain at your breast as long as he or she is latched on well and actively sucking. Your baby will let you know when he or she is done breastfeeding by pulling  away from your breast or falling asleep.  Avoid introducing bottles or pacifiers to your baby in the early weeks of breastfeeding. Wait to introduce these things until after resolving any breastfeeding challenges.  Try to pump your milk on the same schedule as when your baby would breastfeed if you are returning to work or away from home for an extended period.  Drink plenty of fluids to avoid dehydration, which can eventually put you at greater risk of breast engorgement. If you follow these suggestions, your engorgement should improve in 24-48 hours. If you are still experiencing difficulty, call your lactation consultant or health care provider.  Challenge--Plugged Milk Ducts Plugged milk ducts occur when the duct does not drain milk effectively and becomes swollen. Wearing a tight-fitting nursing bra or having difficulty with latching may cause plugged milk ducts. Not drinking enough water (8-10 c [1.9-2.4 L] per day) can contribute to plugged milk ducts. Once a duct has become plugged, hard lumps, soreness,  and redness may develop in your breast.  Solution Do not delay feedings. Feed your baby frequently and try to empty your breasts of milk at each feeding. Try breastfeeding from the affected side first so there is a better chance that the milk will drain completely from that breast. Apply warm, moist towels to your breasts for 5-10 minutes before feeding. Alternatively, a hot shower right before breastfeeding can provide the moist heat that can encourage milk flow. Gentle massage of the sore area before and during a feeding may also help. Avoid wearing tight clothing or bras that put pressure on your breasts. Wear bras that offer good support to your breasts, but avoid underwire bras. If you have a plugged milk duct and develop a fever, you need to see your health care provider.  Challenge--Mastitis Mastitis is inflammation of your breast. It usually is caused by a bacterial infection and can cause flu-like symptoms. You may develop redness in your breast and a fever. Often when mastitis occurs, your breast becomes firm, warm, and very painful. The most common causes of mastitis are poor latching, ineffective sucking from your baby, consistent pressure on your breast (possibly from wearing a tight-fitting bra or shirt that restricts the milk flow), unusual stress or fatigue, or missed feedings.  Solution You will be given antibiotic medicine to treat the infection. It is still important to breastfeed frequently to empty your breasts. Continuing to breastfeed while you recover from mastitis will not harm your baby. Make sure your baby is positioned properly during every feeding. Apply moist heat to your breasts for a few minutes before feeding to help the milk flow and to help your breasts empty more easily. Challenge--Thrush Ginette Pitman is a yeast infection that can form on your nipples, in your breast, or in your baby's mouth. It causes itching, soreness, burning or stabbing pain, and sometimes a rash.   Solution You will be given a medicated ointment for your nipples, and your baby will be given a liquid medicine for his or her mouth. It is important that you and your baby are treated at the same time because thrush can be passed between you and your baby. Change disposable nursing pads often. Any bras, towels, or clothing that come in contact with infected areas of your body or your baby's body need to be washed in very hot water every day. Wash your hands and your baby's hands often. All pacifiers, bottle nipples, or toys your baby puts in his or her mouth should be boiled  once a day for 20 minutes. After 1 week of treatment, discard pacifiers and bottle nipples and buy new ones. All breast pump parts that touch the milk need to be boiled for 20 minutes every day. Challenge--Low Milk Supply You may not be producing enough milk if your baby is not gaining the proper amount of weight. Breast milk production is based on a supply-and-demand system. Your milk supply depends on how frequently and effectively your baby empties your breast. Solution The more you breastfeed and pump, the more breast milk you will produce. It is important that your baby empties at least one of your breasts at each feeding. If this is not happening, then use a breast pump or hand express any milk that remains. This will help to drain as much milk as possible at each feeding. It will also signal your body to produce more milk. If your baby is not emptying your breasts, it may be due to latching, sucking, or positioning problems. If low milk supply continues after addressing these issues, contact your health care provider or a lactation specialist as soon as possible. Challenge--Inverted or Flat Nipples Some women have nipples that turn inward instead of protruding outward. Other women have nipples that are flat. Inverted or flat nipples can sometimes make it more difficult for your baby to latch onto your breast. Solution You may  be given a small device that pulls out inverted nipples. This device should be applied right before your baby is brought to your breast. You can also try using a breast pump for a short time before placing the baby at your breast. The pump can pull your nipple outwards to help your infant latch more easily. The baby's sucking motion will help the inverted nipple protrude as well.  If you have flat nipples, encourage your baby to latch onto your breast and feed frequently in the early days after birth. This will give your baby practice latching on correctly while your breast is still soft. When your milk supply increases, between the second and fifth day after birth and your breasts become full, your baby will have an easier time latching.  Contact a lactation consultant if you still have concerns. She or he can teach you additional techniques to address breastfeeding problems related to nipple shape and position.  FOR MORE INFORMATION [employer]: www.llli.org Document Released: 08/10/2005 Document Revised: 02/21/2013 Document Reviewed: 08/12/2012 [facility] Patient Information 2015 [place]. This information is not intended to replace advice given to you by your health care provider. Make sure you discuss any questions you have with your health care provider.

## 2014-04-07 NOTE — MAU Provider Note (Signed)
  History     CSN: [id number]  Arrival date and time: 04/07/14 1324   First Provider Initiated Contact with Patient 04/07/14 1531      Chief Complaint  Patient presents with  . Mastitis    HPIpt delivered viable female 04/03/2014.  Pt is pumping her breasts then feeding baby.   Pt c/o of left breast tenderness/pain. Pt denies fever- has had some chills.  RN note: Registered Nurse Signed  MAU Note 04/07/2014 2:03 PM    Expand All Collapse All   Pt states here for left breast redness/tenderness. Appears to have mastitis. Other breast wnl. Denies fever.          Past Medical History  Diagnosis Date  . Anemia     Past Surgical History  Procedure Laterality Date  . Wisdom tooth extraction      History reviewed. No pertinent family history.  History  Substance Use Topics  . Smoking status: Never Smoker   . Smokeless tobacco: Not on file  . Alcohol Use: Yes     Comment: not while pregnant    Allergies: No Known Allergies  Prescriptions prior to admission  Medication Sig Dispense Refill Last Dose  . acetaminophen (TYLENOL) 500 MG tablet Take 1,000 mg by mouth every 6 (six) hours as needed.   Past Week at Unknown time  . ibuprofen (ADVIL,MOTRIN) 600 MG tablet Take 1 tablet (600 mg total) by mouth every 6 (six) hours. 30 tablet 0   . prenatal vitamin w/FE, FA (PRENATAL 1 + 1) 27-1 MG TABS tablet Take 1 tablet by mouth daily at 12 noon.   Past Week at Unknown time    Review of Systems  Constitutional: Negative for fever.  Gastrointestinal: Negative for abdominal pain, diarrhea and constipation.  Genitourinary: Negative for dysuria.   Physical Exam   Blood pressure 143/91, pulse 69, temperature 98.4 F (36.9 C), temperature source Oral, resp. rate 16, height 5\' 9"  (1.753 m), weight 145 lb 6 oz (65.942 kg), last menstrual period 06/20/2013, currently breastfeeding.  Physical Exam  Nursing note and vitals reviewed. Constitutional: She is oriented to person, place, and  time. She appears well-developed and well-nourished. No distress.  HENT:  Head: Normocephalic.  Eyes: Pupils are equal, round, and reactive to light.  Neck: Normal range of motion.  Cardiovascular: Normal rate.   Respiratory: Effort normal.  Left breast diffusely reddened and engorged  GI: Soft.  Musculoskeletal: Normal range of motion.  Neurological: She is alert and oriented to person, place, and time.  Skin: Skin is warm and dry.  Psychiatric: She has a normal mood and affect.    MAU Course  Procedures  Ice pack applied to left breast with some relief Lactation consultant came to see pt Pt is interested in stopping breast feeding Pt's blood pressure slightly elevated- cautioned about sudafed only as needed   Assessment and Plan  Left breast engorgement Instructions by lactation consultant Borderline BP- f/u with BP in office on Monday  Daylyn Azbill 04/07/2014, 3:32 PM

## 2014-04-07 NOTE — MAU Note (Signed)
Pt states here for left breast redness/tenderness. Appears to have mastitis. Other breast wnl. Denies fever.

## 2014-04-09 ENCOUNTER — Ambulatory Visit (HOSPITAL_COMMUNITY): Payer: 59

## 2014-04-11 NOTE — Discharge Summary (Signed)
Obstetric Discharge Summary Reason for Admission: induction of labor Prenatal Procedures: none Intrapartum Procedures: spontaneous vaginal delivery Postpartum Procedures: none Complications-Operative and Postpartum: labial laceration  HEMOGLOBIN  Date Value Ref Range Status  04/04/2014 9.3* 12.0 - 15.0 g/dL Final    Comment:    DELTA CHECK NOTED REPEATED TO VERIFY    HCT  Date Value Ref Range Status  04/04/2014 28.3* 36.0 - 46.0 % Final   Hospital Course: 24yo G1P0@ 125w0d who present for IOL due to postdates. Pregnancy has been uncomplicated to date.  Induction was started with cytotec and proceeded with foley balloon and Pitocin. She received an epidural for pain management. She spontaneously ruptured and progressed to complete dilation. At 4:21 PM a viable female was delivered via Vaginal, Spontaneous Delivery (Presentation: Left Occiput Anterior). Right labial laceration was noted and repaired in the usual fashion. Her postpartum course was uncomplicated and she was discharged home in stable condition on PPD#2.  Physical Exam:  General: alert, cooperative and no distress Lochia: appropriate Uterine Fundus: firm, non-tender, below umbilicus DVT Evaluation: No evidence of DVT seen on physical exam.  Discharge Diagnoses: Post-date pregnancy-delivered  Discharge Information: Date: 04/11/2014 Activity: unrestricted Diet: routine Medications: PNV and Ibuprofen Condition: stable Instructions: refer to practice specific booklet Discharge to: home Follow-up Information    Follow up with Myna HidalgoZAN, Chala Gul, M, DO In 2 weeks.   Specialty:  Obstetrics and Gynecology   Contact information:   [address] [place] [id number] [phone]       Newborn Data: Live born female  Birth Weight: 7 lb 9.3 oz (3439 g) APGAR: 9, 9  Home with mother.  Myna HidalgoZAN, Merlean Pizzini, M 04/11/2014, 7:53 AM

## 2016-03-02 NOTE — L&D Delivery Note (Signed)
Delivery Note At 5:13 PM a viable female was delivered via Vaginal, Spontaneous Delivery (Presentation: OA).  APGAR: 8, 9; weight  pending.   Placenta status: to L&D .  Cord:  with the following complications: none.  Cord pH: n/a  Anesthesia:  Epidural and local Episiotomy: Right Mediolateral Lacerations: None Suture Repair: 2.0 3.0 vicryl Est. Blood Loss (mL): 250  Mom to postpartum.  Baby to Couplet care / Skin to Skin.  Myna HidalgoZAN, Laynie Espy, M 12/17/2016, 5:36 PM

## 2016-05-22 ENCOUNTER — Inpatient Hospital Stay (HOSPITAL_COMMUNITY)
Admission: AD | Admit: 2016-05-22 | Discharge: 2016-05-22 | Disposition: A | Discharge status: Home / Self Care | Payer: Managed Care, Other (non HMO) | Source: Ambulatory Visit | Attending: Obstetrics & Gynecology | Admitting: Obstetrics & Gynecology

## 2016-05-22 ENCOUNTER — Encounter (HOSPITAL_COMMUNITY): Payer: Self-pay | Admitting: *Deleted

## 2016-05-22 ENCOUNTER — Inpatient Hospital Stay (HOSPITAL_COMMUNITY): Payer: Managed Care, Other (non HMO)

## 2016-05-22 DIAGNOSIS — Z3A08 8 weeks gestation of pregnancy: Secondary | ICD-10-CM | POA: Diagnosis not present

## 2016-05-22 DIAGNOSIS — O209 Hemorrhage in early pregnancy, unspecified: Secondary | ICD-10-CM | POA: Insufficient documentation

## 2016-05-22 DIAGNOSIS — Z3491 Encounter for supervision of normal pregnancy, unspecified, first trimester: Secondary | ICD-10-CM

## 2016-05-22 LAB — URINALYSIS, ROUTINE W REFLEX MICROSCOPIC
BILIRUBIN URINE: NEGATIVE
Glucose, UA: NEGATIVE mg/dL
Ketones, ur: NEGATIVE mg/dL
NITRITE: NEGATIVE
PROTEIN: NEGATIVE mg/dL
Specific Gravity, Urine: 1.006 (ref 1.005–1.030)
pH: 7 (ref 5.0–8.0)

## 2016-05-22 LAB — WET PREP, GENITAL
Clue Cells Wet Prep HPF POC: NONE SEEN
Sperm: NONE SEEN
Trich, Wet Prep: NONE SEEN
Yeast Wet Prep HPF POC: NONE SEEN

## 2016-05-22 LAB — CBC
HCT: 37.5 % (ref 36.0–46.0)
Hemoglobin: 12.7 g/dL (ref 12.0–15.0)
MCH: 28.3 pg (ref 26.0–34.0)
MCHC: 33.9 g/dL (ref 30.0–36.0)
MCV: 83.7 fL (ref 78.0–100.0)
Platelets: 237 10*3/uL (ref 150–400)
RBC: 4.48 MIL/uL (ref 3.87–5.11)
RDW: 13.1 % (ref 11.5–15.5)
WBC: 8.8 10*3/uL (ref 4.0–10.5)

## 2016-05-22 LAB — HCG, QUANTITATIVE, PREGNANCY: hCG, Beta Chain, Quant, S: 78877 m[IU]/mL — ABNORMAL HIGH (ref <5)

## 2016-05-22 LAB — POCT PREGNANCY, URINE: PREG TEST UR: POSITIVE — AB

## 2016-05-22 NOTE — MAU Provider Note (Signed)
Chief Complaint: Vaginal Bleeding   None     SUBJECTIVE HPI: Tamara [name] is a 26 y.o. G2P1001 at [redacted]w[redacted]d by LMP who presents to maternity admissions reporting brown spotting x 5 days with a slight increase in amount today.  The bleeding is associated with occasional cramping. She has not tried any treatments. She has an [place] and new OB visit scheduled in her OB office next week but came in because she was worried about the pregnancy.  She denies vaginal itching/burning, urinary symptoms, h/a, dizziness, n/v, or fever/chills.     HPI  Past Medical History:  Diagnosis Date  . Anemia    Past Surgical History:  Procedure Laterality Date  . WISDOM TOOTH EXTRACTION     Social History   Social History  . Marital status: Married    Spouse name: N/A  . Number of children: N/A  . Years of education: N/A   Occupational History  . Not on file.   Social History Main Topics  . Smoking status: Never Smoker  . Smokeless tobacco: Never Used  . Alcohol use Yes     Comment: not while pregnant  . Drug use: No  . Sexual activity: Yes     Comment: last sex two hours ago   Other Topics Concern  . Not on file   Social History Narrative  . No narrative on file   No current facility-administered medications on file prior to encounter.    Current Outpatient Prescriptions on File Prior to Encounter  Medication Sig Dispense Refill  . acetaminophen (TYLENOL) 500 MG tablet Take 1,000 mg by mouth every 6 (six) hours as needed for moderate pain.     . prenatal vitamin w/FE, FA (PRENATAL 1 + 1) 27-1 MG TABS tablet Take 1 tablet by mouth daily at 12 noon.     No Known Allergies  ROS:  Review of Systems  Constitutional: Negative for chills, fatigue and fever.  Respiratory: Negative for shortness of breath.   Cardiovascular: Negative for chest pain.  Gastrointestinal: Negative for nausea and vomiting.  Genitourinary: Positive for pelvic pain and vaginal bleeding. Negative for difficulty  urinating, dysuria, flank pain, vaginal discharge and vaginal pain.  Neurological: Negative for dizziness and headaches.  Psychiatric/Behavioral: Negative.      I have reviewed patient's Past Medical Hx, Surgical Hx, Family Hx, Social Hx, medications and allergies.   Physical Exam  Patient Vitals for the past 24 hrs:  BP Temp Temp src Pulse Resp Height Weight  05/22/16 2343 115/70 98.1 F (36.7 C) Oral 72 18 - -  05/22/16 2035 134/84 - - 73 17 - -  05/22/16 1955 (!) 148/86 98.5 F (36.9 C) - 71 18 5\' 9"  (1.753 m) 124 lb (56.2 kg)   Constitutional: Well-developed, well-nourished female in no acute distress.  Cardiovascular: normal rate Respiratory: normal effort GI: Abd soft, non-tender. Pos BS x 4 MS: Extremities nontender, no edema, normal ROM Neurologic: Alert and oriented x 4.  GU: Neg CVAT.  PELVIC EXAM: Cervix pink, visually closed, without lesion, small amount light brown discharge, vaginal walls and external genitalia normal Bimanual exam: Cervix 0/long/high, firm, anterior, neg CMT, uterus nontender, slightly enlarged, adnexa without tenderness, enlargement, or mass   LAB RESULTS Results for orders placed or performed during the hospital encounter of 05/22/16 (from the past 24 hour(s))  Urinalysis, Routine w reflex microscopic     Status: Abnormal   Collection Time: 05/22/16  8:01 PM  Result Value Ref Range   Color,  Urine STRAW (A) YELLOW   APPearance CLEAR CLEAR   Specific Gravity, Urine 1.006 1.005 - 1.030   pH 7.0 5.0 - 8.0   Glucose, UA NEGATIVE NEGATIVE mg/dL   Hgb urine dipstick MODERATE (A) NEGATIVE   Bilirubin Urine NEGATIVE NEGATIVE   Ketones, ur NEGATIVE NEGATIVE mg/dL   Protein, ur NEGATIVE NEGATIVE mg/dL   Nitrite NEGATIVE NEGATIVE   Leukocytes, UA TRACE (A) NEGATIVE   RBC / HPF 0-5 0 - 5 RBC/hpf   WBC, UA 0-5 0 - 5 WBC/hpf   Bacteria, UA RARE (A) NONE SEEN   Squamous Epithelial / LPF 0-5 (A) NONE SEEN  Pregnancy, urine POC     Status: Abnormal    Collection Time: 05/22/16  8:24 PM  Result Value Ref Range   Preg Test, Ur POSITIVE (A) NEGATIVE  CBC     Status: None   Collection Time: 05/22/16  9:45 PM  Result Value Ref Range   WBC 8.8 4.0 - 10.5 K/uL   RBC 4.48 3.87 - 5.11 MIL/uL   Hemoglobin 12.7 12.0 - 15.0 g/dL   HCT 54.037.5 98.136.0 - 19.146.0 %   MCV 83.7 78.0 - 100.0 fL   MCH 28.3 26.0 - 34.0 pg   MCHC 33.9 30.0 - 36.0 g/dL   RDW 47.813.1 29.511.5 - 62.115.5 %   Platelets 237 150 - 400 K/uL  hCG, quantitative, pregnancy     Status: Abnormal   Collection Time: 05/22/16  9:45 PM  Result Value Ref Range   hCG, Beta Chain, Quant, S 78,877 (H) <5 mIU/mL  Wet prep, genital     Status: Abnormal   Collection Time: 05/22/16 11:25 PM  Result Value Ref Range   Yeast Wet Prep HPF POC NONE SEEN NONE SEEN   Trich, Wet Prep NONE SEEN NONE SEEN   Clue Cells Wet Prep HPF POC NONE SEEN NONE SEEN   WBC, Wet Prep HPF POC MANY (A) NONE SEEN   Sperm NONE SEEN        IMAGING [place] Ob Comp Less 14 Wks  Result Date: 05/22/2016 CLINICAL DATA:  Vaginal spotting EXAM: OBSTETRIC <14 WK US AND TRANSVAGINAL OB US TECHNIQUE: Both transabdominal and transvaginal ultrasound examinations were performed for complete evaluation of the gestation as well as the maternal uterus, adnexal regions, and pelvic cul-de-sac. Transvaginal technique was performed to assess early pregnancy. COMPARISON:  None. FINDINGS: Intrauterine gestational sac: Single intrauterine gestation Yolk sac:  Visualized Embryo:  Visualized Cardiac Activity: Visualized Heart Rate: 178  bpm CRL:  21.9  mm   8 w   5 d                  US EDC: 12/27/2016 Subchorionic hemorrhage:  None visualized. Maternal uterus/adnexae: Ovaries are within normal limits. The right ovary measures 2.3 x 1.7 x 2.3 cm. The left ovary measures 2.6 x 1.8 x 1.7 cm. No free fluid. IMPRESSION: Single viable intrauterine gestation as above. There are no other abnormalities visualized. Electronically Signed   By: Jasmine PangKim  Fujinaga M.D.   On:  05/22/2016 22:34   [place] Ob Transvaginal  Result Date: 05/22/2016 CLINICAL DATA:  Vaginal spotting EXAM: OBSTETRIC <14 WK US AND TRANSVAGINAL OB US TECHNIQUE: Both transabdominal and transvaginal ultrasound examinations were performed for complete evaluation of the gestation as well as the maternal uterus, adnexal regions, and pelvic cul-de-sac. Transvaginal technique was performed to assess early pregnancy. COMPARISON:  None. FINDINGS: Intrauterine gestational sac: Single intrauterine gestation Yolk sac:  Visualized Embryo:  Visualized Cardiac  Activity: Visualized Heart Rate: 178  bpm CRL:  21.9  mm   8 w   5 d                  [place] EDC: 12/27/2016 Subchorionic hemorrhage:  None visualized. Maternal uterus/adnexae: Ovaries are within normal limits. The right ovary measures 2.3 x 1.7 x 2.3 cm. The left ovary measures 2.6 x 1.8 x 1.7 cm. No free fluid. IMPRESSION: Single viable intrauterine gestation as above. There are no other abnormalities visualized. Electronically Signed   By: Jasmine Pang M.D.   On: 05/22/2016 22:34    MAU Management/MDM: Ordered labs and [place] and reviewed results.  IUP with normal FHR on today's [place].  Wet prep wnl.  GCC pending.  Reassurance provided to pt. Pt to keep upcoming new OB appt and [place] at [facility].  EDD based on today's [place], 7 days different from LMP.  Pt stable at time of discharge.  ASSESSMENT 1. Normal IUP (intrauterine pregnancy) on prenatal ultrasound, first trimester   2. Vaginal bleeding in pregnancy, first trimester     PLAN Discharge home with bleeding precautions  Allergies as of 05/22/2016   No Known Allergies     Medication List    TAKE these medications   acetaminophen 500 MG tablet Commonly known as:  TYLENOL Take 1,000 mg by mouth every 6 (six) hours as needed for moderate pain.   prenatal vitamin w/FE, FA 27-1 MG Tabs tablet Take 1 tablet by mouth daily at 12 noon.      Follow-up Information    Myna Hidalgo, M, DO Follow up.    Specialty:  Obstetrics and Gynecology Why:  As scheduled for initial OB and [place] this week, return to MAU as needed for emergencies. Contact information: [address] [place] [address] [phone]           Sharen Counter Certified Nurse-Midwife 05/22/2016  11:53 PM

## 2016-05-22 NOTE — MAU Note (Signed)
Have had light spotting since SUnday. Brown in color. Called office Monday and scheduled for u/s 3/28 in office. Today having alittle more spotting but still brown in color. Occ cramping and pain in LLQ.

## 2016-05-25 LAB — GC/CHLAMYDIA PROBE AMP (~~LOC~~) NOT AT ARMC
CHLAMYDIA, DNA PROBE: NEGATIVE
NEISSERIA GONORRHEA: NEGATIVE

## 2016-05-27 LAB — OB RESULTS CONSOLE HEPATITIS B SURFACE ANTIGEN: Hepatitis B Surface Ag: NEGATIVE

## 2016-05-27 LAB — OB RESULTS CONSOLE GC/CHLAMYDIA
Chlamydia: NEGATIVE
GC PROBE AMP, GENITAL: NEGATIVE

## 2016-05-27 LAB — OB RESULTS CONSOLE ANTIBODY SCREEN: Antibody Screen: NEGATIVE

## 2016-05-27 LAB — OB RESULTS CONSOLE ABO/RH: RH Type: POSITIVE

## 2016-05-27 LAB — OB RESULTS CONSOLE RPR: RPR: NONREACTIVE

## 2016-05-27 LAB — OB RESULTS CONSOLE RUBELLA ANTIBODY, IGM: RUBELLA: IMMUNE

## 2016-05-27 LAB — OB RESULTS CONSOLE HIV ANTIBODY (ROUTINE TESTING): HIV: NONREACTIVE

## 2016-06-03 ENCOUNTER — Other Ambulatory Visit (HOSPITAL_COMMUNITY)
Admission: RE | Admit: 2016-06-03 | Discharge: 2016-06-03 | Disposition: A | Discharge status: Home / Self Care | Payer: Managed Care, Other (non HMO) | Source: Ambulatory Visit | Attending: Obstetrics & Gynecology | Admitting: Obstetrics & Gynecology

## 2016-06-03 ENCOUNTER — Other Ambulatory Visit: Payer: Self-pay | Admitting: Obstetrics & Gynecology

## 2016-06-03 DIAGNOSIS — Z113 Encounter for screening for infections with a predominantly sexual mode of transmission: Secondary | ICD-10-CM | POA: Diagnosis present

## 2016-06-03 DIAGNOSIS — Z01419 Encounter for gynecological examination (general) (routine) without abnormal findings: Secondary | ICD-10-CM | POA: Diagnosis not present

## 2016-06-05 LAB — CYTOLOGY - PAP
Chlamydia: NEGATIVE
DIAGNOSIS: NEGATIVE
NEISSERIA GONORRHEA: NEGATIVE

## 2016-11-27 LAB — OB RESULTS CONSOLE GBS: GBS: NEGATIVE

## 2016-12-07 ENCOUNTER — Encounter (HOSPITAL_COMMUNITY): Payer: Self-pay | Admitting: *Deleted

## 2016-12-07 ENCOUNTER — Telehealth (HOSPITAL_COMMUNITY): Payer: Self-pay | Admitting: *Deleted

## 2016-12-07 NOTE — Telephone Encounter (Signed)
Preadmission screen  

## 2016-12-16 NOTE — H&P (Addendum)
HPI: 26 y/o G2P1001 @ 3922w3d estimated gestational age (as dated by LMP c/w 20 week ultrasound) presents for scheduled IOL.   no Leaking of Fluid,   no Vaginal Bleeding,   irregular Uterine Contractions,  + Fetal Movement.  Prenatal care has been provided by Dr. Charlotta Newtonzan  ROS: no HA, no epigastric pain, no visual changes.    Pregnancy uncomplicated   Prenatal Transfer Tool  Maternal Diabetes: No Genetic Screening: Normal Maternal Ultrasounds/Referrals: Normal Fetal Ultrasounds or other Referrals:  None Maternal Substance Abuse:  No Significant Maternal Medications:  None Significant Maternal Lab Results: Lab values include: Group B Strep negative   PNL:  GBS negative, Rub Immune, Hep B neg, RPR NR, HIV neg, GC/C neg, glucola:107 Hgb: 11.4 Blood type: B positive, antibody neg  Immunizations: Tdap: 8/15 Flu: 9/13  OBHx: FTNSVD x 1, uncomplicated, female 7#9oz PMHx:  none Meds:  PNV Allergy:  No Known Allergies SurgHx: none SocHx:   no Tobacco, no  EtOH, no Illicit Drugs  O: BP 131/80 (BP Location: Left Arm)   Pulse 84   Temp 98.4 F (36.9 C) (Oral)   Resp 14   Ht 5' 8.5" (1.74 m)   Wt 68 kg (150 lb)   LMP 03/16/2016   BMI 22.48 kg/m  Gen. AAOx3, NAD CV.  RRR  No murmur.  Resp. CTAB, no wheeze or crackles. Abd. Gravid,  no tenderness,  no rigidity,  no guarding Extr.  no edema B/L , no calf tenderness, neg Homan's B/L FHT: 145 by doppler SVE: 2-3/50/-3, vertex by exam (in office)  Labs: see orders  A/P:  26 y.o. G2P1001 @ 7722w3d EGA who presents for elective IOL  -FWB:  NICHD Cat I FHTs -Labor: plan for Pit per protocol -GBS: negative -Pain management IV or epidural upon request  Myna HidalgoJennifer Noeli Lavery, DO [phone] (cell) [phone] (office)

## 2016-12-17 ENCOUNTER — Encounter (HOSPITAL_COMMUNITY): Payer: Self-pay

## 2016-12-17 ENCOUNTER — Inpatient Hospital Stay (HOSPITAL_COMMUNITY): Payer: Managed Care, Other (non HMO) | Admitting: Anesthesiology

## 2016-12-17 ENCOUNTER — Inpatient Hospital Stay (HOSPITAL_COMMUNITY)
Admission: RE | Admit: 2016-12-17 | Discharge: 2016-12-19 | DRG: 807 | Disposition: A | Discharge status: Home / Self Care | Payer: Managed Care, Other (non HMO) | Source: Ambulatory Visit | Attending: Obstetrics & Gynecology | Admitting: Obstetrics & Gynecology

## 2016-12-17 DIAGNOSIS — O26893 Other specified pregnancy related conditions, third trimester: Secondary | ICD-10-CM | POA: Diagnosis present

## 2016-12-17 DIAGNOSIS — Z3A39 39 weeks gestation of pregnancy: Secondary | ICD-10-CM

## 2016-12-17 LAB — CBC
HEMATOCRIT: 38.2 % (ref 36.0–46.0)
Hemoglobin: 12.3 g/dL (ref 12.0–15.0)
MCH: 25.9 pg — ABNORMAL LOW (ref 26.0–34.0)
MCHC: 32.2 g/dL (ref 30.0–36.0)
MCV: 80.4 fL (ref 78.0–100.0)
PLATELETS: 220 10*3/uL (ref 150–400)
RBC: 4.75 MIL/uL (ref 3.87–5.11)
RDW: 14.9 % (ref 11.5–15.5)
WBC: 14.4 10*3/uL — ABNORMAL HIGH (ref 4.0–10.5)

## 2016-12-17 LAB — RPR: RPR Ser Ql: NONREACTIVE

## 2016-12-17 LAB — TYPE AND SCREEN
ABO/RH(D): B POS
ANTIBODY SCREEN: NEGATIVE

## 2016-12-17 MED ORDER — ONDANSETRON HCL 4 MG/2ML IJ SOLN: 4.0000 mg | Freq: Four times a day (QID) | INTRAMUSCULAR | Status: DC | PRN

## 2016-12-17 MED ORDER — ACETAMINOPHEN 325 MG PO TABS: 650.0000 mg | ORAL_TABLET | ORAL | Status: DC | PRN

## 2016-12-17 MED ORDER — EPHEDRINE 5 MG/ML INJ: 10.0000 mg | INTRAVENOUS | Status: DC | PRN

## 2016-12-17 MED ORDER — LACTATED RINGERS IV SOLN: 500.0000 mL | Freq: Once | INTRAVENOUS | Status: DC

## 2016-12-17 MED ORDER — SIMETHICONE 80 MG PO CHEW
80.0000 mg | CHEWABLE_TABLET | ORAL | Status: DC | PRN
Start: 2016-12-17 — End: 2016-12-19

## 2016-12-17 MED ORDER — DIPHENHYDRAMINE HCL 50 MG/ML IJ SOLN: 12.5000 mg | INTRAMUSCULAR | Status: DC | PRN

## 2016-12-17 MED ORDER — OXYTOCIN 40 UNITS IN LACTATED RINGERS INFUSION - SIMPLE MED
1.0000 m[IU]/min | INTRAVENOUS | Status: DC
  Administered 2016-12-17: 2 m[IU]/min via INTRAVENOUS
  Filled 2016-12-17: qty 1000

## 2016-12-17 MED ORDER — IBUPROFEN 600 MG PO TABS
600.0000 mg | ORAL_TABLET | Freq: Four times a day (QID) | ORAL | Status: DC
  Administered 2016-12-17 – 2016-12-19 (×7): 600 mg via ORAL
  Filled 2016-12-17 (×7): qty 1

## 2016-12-17 MED ORDER — PHENYLEPHRINE 40 MCG/ML (10ML) SYRINGE FOR IV PUSH (FOR BLOOD PRESSURE SUPPORT)
80.0000 ug | PREFILLED_SYRINGE | INTRAVENOUS | Status: DC | PRN
  Filled 2016-12-17: qty 5
  Filled 2016-12-17: qty 10

## 2016-12-17 MED ORDER — PHENYLEPHRINE 40 MCG/ML (10ML) SYRINGE FOR IV PUSH (FOR BLOOD PRESSURE SUPPORT): 80.0000 ug | PREFILLED_SYRINGE | INTRAVENOUS | Status: DC | PRN

## 2016-12-17 MED ORDER — FENTANYL 2.5 MCG/ML BUPIVACAINE 1/10 % EPIDURAL INFUSION (WH - ANES)
14.0000 mL/h | INTRAMUSCULAR | Status: DC | PRN
  Administered 2016-12-17: 14 mL/h via EPIDURAL
  Filled 2016-12-17: qty 100

## 2016-12-17 MED ORDER — PHENYLEPHRINE 40 MCG/ML (10ML) SYRINGE FOR IV PUSH (FOR BLOOD PRESSURE SUPPORT)
80.0000 ug | PREFILLED_SYRINGE | INTRAVENOUS | Status: DC | PRN
  Filled 2016-12-17: qty 5

## 2016-12-17 MED ORDER — ONDANSETRON HCL 4 MG/2ML IJ SOLN: 4.0000 mg | INTRAMUSCULAR | Status: DC | PRN

## 2016-12-17 MED ORDER — BENZOCAINE-MENTHOL 20-0.5 % EX AERO
1.0000 "application " | INHALATION_SPRAY | CUTANEOUS | Status: DC | PRN
  Filled 2016-12-17: qty 56

## 2016-12-17 MED ORDER — LACTATED RINGERS IV SOLN
500.0000 mL | Freq: Once | INTRAVENOUS | Status: AC
  Administered 2016-12-17: 1000 mL via INTRAVENOUS

## 2016-12-17 MED ORDER — LACTATED RINGERS IV SOLN: 500.0000 mL | INTRAVENOUS | Status: DC | PRN

## 2016-12-17 MED ORDER — EPHEDRINE 5 MG/ML INJ
10.0000 mg | INTRAVENOUS | Status: DC | PRN
  Filled 2016-12-17: qty 2

## 2016-12-17 MED ORDER — ONDANSETRON HCL 4 MG PO TABS: 4.0000 mg | ORAL_TABLET | ORAL | Status: DC | PRN

## 2016-12-17 MED ORDER — LIDOCAINE HCL (PF) 1 % IJ SOLN
INTRAMUSCULAR | Status: DC | PRN
  Administered 2016-12-17 (×2): 5 mL via EPIDURAL

## 2016-12-17 MED ORDER — SOD CITRATE-CITRIC ACID 500-334 MG/5ML PO SOLN: 30.0000 mL | ORAL | Status: DC | PRN

## 2016-12-17 MED ORDER — LACTATED RINGERS IV SOLN
INTRAVENOUS | Status: DC
  Administered 2016-12-17: 13:00:00 via INTRAVENOUS

## 2016-12-17 MED ORDER — ZOLPIDEM TARTRATE 5 MG PO TABS: 5.0000 mg | ORAL_TABLET | Freq: Every evening | ORAL | Status: DC | PRN

## 2016-12-17 MED ORDER — DIPHENHYDRAMINE HCL 25 MG PO CAPS: 25.0000 mg | ORAL_CAPSULE | Freq: Four times a day (QID) | ORAL | Status: DC | PRN

## 2016-12-17 MED ORDER — OXYCODONE-ACETAMINOPHEN 5-325 MG PO TABS: 1.0000 | ORAL_TABLET | ORAL | Status: DC | PRN

## 2016-12-17 MED ORDER — OXYTOCIN BOLUS FROM INFUSION
500.0000 mL | Freq: Once | INTRAVENOUS | Status: AC
  Administered 2016-12-17: 500 mL via INTRAVENOUS

## 2016-12-17 MED ORDER — PRENATAL MULTIVITAMIN CH
1.0000 | ORAL_TABLET | Freq: Every day | ORAL | Status: DC
  Administered 2016-12-18: 1 via ORAL
  Filled 2016-12-17: qty 1

## 2016-12-17 MED ORDER — SENNOSIDES-DOCUSATE SODIUM 8.6-50 MG PO TABS
2.0000 | ORAL_TABLET | ORAL | Status: DC
  Administered 2016-12-17 – 2016-12-18 (×2): 2 via ORAL
  Filled 2016-12-17 (×2): qty 2

## 2016-12-17 MED ORDER — TERBUTALINE SULFATE 1 MG/ML IJ SOLN
0.2500 mg | Freq: Once | INTRAMUSCULAR | Status: DC | PRN
  Filled 2016-12-17: qty 1

## 2016-12-17 MED ORDER — OXYCODONE-ACETAMINOPHEN 5-325 MG PO TABS: 2.0000 | ORAL_TABLET | ORAL | Status: DC | PRN

## 2016-12-17 MED ORDER — LIDOCAINE HCL (PF) 1 % IJ SOLN
30.0000 mL | INTRAMUSCULAR | Status: DC | PRN
  Administered 2016-12-17: 30 mL via SUBCUTANEOUS
  Filled 2016-12-17 (×2): qty 30

## 2016-12-17 MED ORDER — COCONUT OIL OIL: 1.0000 "application " | TOPICAL_OIL | Status: DC | PRN

## 2016-12-17 MED ORDER — OXYTOCIN 40 UNITS IN LACTATED RINGERS INFUSION - SIMPLE MED
2.5000 [IU]/h | INTRAVENOUS | Status: DC
  Administered 2016-12-17: 2.5 [IU]/h via INTRAVENOUS

## 2016-12-17 NOTE — Anesthesia Preprocedure Evaluation (Signed)
Anesthesia Evaluation  Patient identified by MRN, date of birth, ID band Patient awake    Reviewed: Allergy & Precautions, H&P , NPO status , Patient's Chart, lab work & pertinent test results  Airway Mallampati: II   Neck ROM: full    Dental   Pulmonary neg pulmonary ROS,    breath sounds clear to auscultation       Cardiovascular negative cardio ROS   Rhythm:regular Rate:Normal     Neuro/Psych    GI/Hepatic   Endo/Other    Renal/GU      Musculoskeletal   Abdominal   Peds  Hematology   Anesthesia Other Findings   Reproductive/Obstetrics (+) Pregnancy                             Anesthesia Physical Anesthesia Plan  ASA: I  Anesthesia Plan: Epidural   Post-op Pain Management:    Induction: Intravenous  PONV Risk Score and Plan: 2 and Treatment may vary due to age or medical condition  Airway Management Planned: Natural Airway  Additional Equipment:   Intra-op Plan:   Post-operative Plan:   Informed Consent: I have reviewed the patients History and Physical, chart, labs and discussed the procedure including the risks, benefits and alternatives for the proposed anesthesia with the patient or authorized representative who has indicated his/her understanding and acceptance.     Plan Discussed with: CRNA, Anesthesiologist and Surgeon  Anesthesia Plan Comments:         Anesthesia Quick Evaluation  

## 2016-12-17 NOTE — Anesthesia Pain Management Evaluation Note (Signed)
  CRNA Pain Management Visit Note  Patient: Tamara [name], 26 y.o., female  "Hello I am a member of the anesthesia team at [facility]. We have an anesthesia team available at all times to provide care throughout the hospital, including epidural management and anesthesia for C-section. I don't know your plan for the delivery whether it a natural birth, water birth, IV sedation, nitrous supplementation, doula or epidural, but we want to meet your pain goals."   1.Was your pain managed to your expectations on prior hospitalizations?   Yes   2.What is your expectation for pain management during this hospitalization?     Epidural  3.How can we help you reach that goal? epidural  Record the patient's initial score and the patient's pain goal.   Pain: 2/10  Pain Goal: 2/10 [facility] wants you to be able to say your pain was always managed very well.  Salome ArntSterling, Zaidyn Claire Marie 12/17/2016

## 2016-12-17 NOTE — Anesthesia Procedure Notes (Signed)
Epidural Patient location during procedure: OB Start time: 12/17/2016 12:10 PM End time: 12/17/2016 12:21 PM  Staffing Anesthesiologist: Chaney MallingHODIERNE, Nocholas Damaso Performed: anesthesiologist   Preanesthetic Checklist Completed: patient identified, site marked, pre-op evaluation, timeout performed, IV checked, risks and benefits discussed and monitors and equipment checked  Epidural Patient position: sitting Prep: DuraPrep Patient monitoring: heart rate, cardiac monitor, continuous pulse ox and blood pressure Approach: midline Location: L2-L3 Injection technique: LOR saline  Needle:  Needle type: Tuohy  Needle gauge: 17 G Needle length: 9 cm Needle insertion depth: 5 cm Catheter type: closed end flexible Catheter size: 19 Gauge Catheter at skin depth: 11 cm Test dose: negative and Other  Assessment Events: blood not aspirated, injection not painful, no injection resistance and negative IV test  Additional Notes Informed consent obtained prior to proceeding including risk of failure, 1% risk of PDPH, risk of minor discomfort and bruising.  Discussed rare but serious complications including epidural abscess, permanent nerve injury, epidural hematoma.  Discussed alternatives to epidural analgesia and patient desires to proceed.  Timeout performed pre-procedure verifying patient name, procedure, and platelet count.  Patient tolerated procedure well. Reason for block:procedure for pain

## 2016-12-17 NOTE — Progress Notes (Signed)
OB PN:  S: Pt resting comfortably, no acute complaints  O: BP 113/68   Pulse 61   Temp 97.9 F (36.6 C) (Oral)   Resp 20   Ht 5' 8.5" (1.74 m)   Wt 68 kg (150 lb)   LMP 03/16/2016   BMI 22.48 kg/m   FHT: 130bpm, moderate variablity, + accels, no decels Toco: q483min SVE: 4/70/-2, AROM, minimal fluid  A/P: 26 y.o. G2P1001 @ 6452w3d for elective IOL 1. FWB: Cat. I 2. Labor: continue Pit per protocol Pain: continue epidural GBS: negative  Myna HidalgoJennifer Jesus Nevills, DO [phone] (pager) [phone] (office)

## 2016-12-18 LAB — CBC
HCT: 31 % — ABNORMAL LOW (ref 36.0–46.0)
Hemoglobin: 9.9 g/dL — ABNORMAL LOW (ref 12.0–15.0)
MCH: 26 pg (ref 26.0–34.0)
MCHC: 31.9 g/dL (ref 30.0–36.0)
MCV: 81.4 fL (ref 78.0–100.0)
PLATELETS: 168 10*3/uL (ref 150–400)
RBC: 3.81 MIL/uL — ABNORMAL LOW (ref 3.87–5.11)
RDW: 14.9 % (ref 11.5–15.5)
WBC: 14.4 10*3/uL — ABNORMAL HIGH (ref 4.0–10.5)

## 2016-12-18 LAB — BIRTH TISSUE RECOVERY COLLECTION (PLACENTA DONATION)

## 2016-12-18 MED ORDER — IBUPROFEN 600 MG PO TABS: 600.0000 mg | ORAL_TABLET | Freq: Four times a day (QID) | ORAL | 0 refills | Status: DC

## 2016-12-18 NOTE — Discharge Instructions (Signed)

## 2016-12-18 NOTE — Lactation Note (Signed)
This note was copied from a baby's chart. Lactation Consultation Note  Patient Name: Tamara [name] [id number]'s Date: 12/18/2016 Reason for consult: Initial assessment   Initial assessment with mom of 18 hour old infant. Infant with 4 BF for 25-60 minutes, 2 BF attempts, 5 voids and 2 stools since birth. LATCH scores 5-8. Infant weight 6 lb 8.4 oz with 2% weight loss since birth.   Mom asked to see lactation as her first BF experience was not successful with latching. Mom was attempting to latch infant STS when LC entered. Infant was asleep and not interested in latching. Worked with mom on hand expression, 1 cc colostrum very easily expressible and was spoon fed to infant. Infant remained asleep. Enc mom to keep her STS. Enc mom to feed infant STS 8-12 x in 24 hours at first feeding cues. Enc mom to use pillow and head support with feedings. Extra pillow given to mom. Enc mom to hand express at beginning of feeding to get milk flowing and after feeding to apply EBM to nipples. Mom reports mild nipple tenderness with initial latch, she is using coconut oil to nipples. Enc Colostrum and then coconut oil post BF. Discussed BF basics, supply and demand, hand expression, milk coming to volume, NB nutritional needs, STS, cluster feeding and spoon feeding. Enc mom to call out for feeding assistance as needed.   Mom asked when she should start pumping and has her Spectra 2 pump with her. Enc mom to try to BF exclusively for 2 weeks unless medically indicated to supplement. Mom voiced understanding. Discussed introducing bottles and pumping with return to work due to [employer]'s questions.   BF Resources Handout and LC Brochure given, mom informed of IP/OP Services, BF Support Groups and LC Phone #. Enc mom to call out for feeding assistance as needed. Mom reports all of her questions/concerns have been answered currently. Follow up tomorrow and prn.   Maternal Data Formula Feeding for Exclusion: No Has  patient been taught Hand Expression?: Yes Does the patient have breastfeeding experience prior to this delivery?: Yes  Feeding Feeding Type: Breast Fed Length of feed: 0 min  LATCH Score Latch: Too sleepy or reluctant, no latch achieved, no sucking elicited.  Audible Swallowing: None  Type of Nipple: Everted at rest and after stimulation  Comfort (Breast/Nipple): Soft / non-tender  Hold (Positioning): No assistance needed to correctly position infant at breast.  LATCH Score: 6  Interventions Interventions: Breast feeding basics reviewed;Support pillows;Assisted with latch;Position options;Skin to skin;Expressed milk;Hand express  Lactation Tools Discussed/Used WIC Program: No   Consult Status Consult Status: Follow-up Date: 12/19/16 Follow-up type: In-patient    Silas FloodSharon S Janera Peugh 12/18/2016, 11:34 AM

## 2016-12-18 NOTE — Progress Notes (Signed)
Postpartum Note Day # 1  S:  Patient resting comfortable in bed.  Pain controlled.  Tolerating general diet. + flatus, + BM.  Lochia moderate.  Ambulating without difficulty.  She denies n/v/f/c, SOB, or CP.  Pt plans on breastfeeding.  O: Temp:  [97.2 F (36.2 C)-98.4 F (36.9 C)] 98.3 F (36.8 C) (10/19 0140) Pulse Rate:  [58-251] 70 (10/19 0140) Resp:  [14-20] 16 (10/19 0140) BP: (103-137)/(61-101) 117/80 (10/19 0140) SpO2:  [99 %-100 %] 99 % (10/18 1940) Weight:  [68 kg (150 lb)] 68 kg (150 lb) (10/18 [id number])   Gen: A&Ox3, NAD CV: RRR Resp: CTAB Abdomen: soft, NT, ND Uterus: firm, non-tender, below umbilicus Ext: No edema, no calf tenderness bilaterally  Labs:  Recent Labs  12/17/16 0705 12/18/16 0529  HGB 12.3 9.9*    A/P: Pt is a 26 y.o. G2P2002 s/p NSVD, PPD#1  - Pain well controlled -GU: UOP is adequate -GI: Tolerating general diet -Activity: encouraged sitting up to chair and ambulation as tolerated -Prophylaxis: early ambulation -Labs: appropriate as above  DISPO: Meeting postpartum milestones appropriately, possible early discharge pending baby's status  Myna HidalgoJennifer Shi [place], [place] [phone] (pager) [phone] (office)

## 2016-12-18 NOTE — Anesthesia Postprocedure Evaluation (Signed)
Anesthesia Post Note  Patient: Tamara [name]  Procedure(s) Performed: AN AD HOC LABOR EPIDURAL     Patient location during evaluation: Mother Baby Anesthesia Type: Epidural Level of consciousness: awake and alert and oriented Pain management: satisfactory to patient Vital Signs Assessment: post-procedure vital signs reviewed and stable Respiratory status: respiratory function stable Cardiovascular status: stable Postop Assessment: no headache, no backache, epidural receding, patient able to bend at knees, no signs of nausea or vomiting and adequate PO intake Anesthetic complications: no    Last Vitals:  Vitals:   12/17/16 2123 12/18/16 0140  BP: 103/63 117/80  Pulse: 63 70  Resp: 16 16  Temp: 36.8 C 36.8 C  SpO2:      Last Pain:  Vitals:   12/18/16 0140  TempSrc: Oral  PainSc: 0-No pain   Pain Goal: Patients Stated Pain Goal: 7 (12/17/16 0720)               Karleen DolphinFUSSELL,Semaja Lymon

## 2016-12-19 NOTE — Discharge Summary (Signed)
OB Discharge Summary     Patient Name: Tamara [name] DOB: April 16, 1990 MRN: [id number]  Date of admission: 12/17/2016 Delivering MD: Myna Hidalgo   Date of discharge: 12/19/2016  Admitting diagnosis: INDUCTION Intrauterine pregnancy: [redacted]w[redacted]d     Secondary diagnosis:  Principal Problem:   Vaginal delivery  Additional problems: None     Discharge diagnosis: Term Pregnancy Delivered                                                                                                Post partum procedures:None  Augmentation: AROM and Pitocin  Complications: None  Hospital course:  Induction of Labor With Vaginal Delivery   26 y.o. yo G2P2002 at [redacted]w[redacted]d was admitted to the hospital 12/17/2016 for induction of labor.  Indication for induction: Favorable cervix at term.  Patient had an uncomplicated labor course as follows: Membrane Rupture Time/Date: 12:54 PM ,12/17/2016   Intrapartum Procedures: Episiotomy: Right Mediolateral [4]                                         Lacerations:  None [1]  Patient had delivery of a Viable infant.  Information for the patient's newborn:  Tamara, [name] [[id number]]  Delivery Method: Vaginal, Spontaneous Delivery (Filed from Delivery Summary)   12/17/2016  Details of delivery can be found in separate delivery note.  Patient had a routine postpartum course. Patient is discharged home 12/19/16.  Patient declined Rx for Ibuprophen--call to patient's pharmacy to inform not to fill that Rx.  Physical exam  Vitals:   12/18/16 0140 12/18/16 0945 12/18/16 1751 12/19/16 0432  BP: 117/80 129/79 118/71 111/71  Pulse: 70 75 79 61  Resp: 16 16 16 16   Temp: 98.3 F (36.8 C) 98.4 F (36.9 C) 98.3 F (36.8 C) 97.8 F (36.6 C)  TempSrc: Oral Oral Oral Oral  SpO2:      Weight:      Height:       General: alert Lochia: appropriate Uterine Fundus: firm Incision: Healing well with no significant drainage DVT Evaluation: No evidence of DVT seen  on physical exam. Negative Homan's sign. Labs: Lab Results  Component Value Date   WBC 14.4 (H) 12/18/2016   HGB 9.9 (L) 12/18/2016   HCT 31.0 (L) 12/18/2016   MCV 81.4 12/18/2016   PLT 168 12/18/2016   No flowsheet data found.  Discharge instruction: per After Visit Summary and "Baby and Me Booklet".  After visit meds:  Allergies as of 12/19/2016   No Known Allergies     Medication List    STOP taking these medications   IRON PO     TAKE these medications   acetaminophen 325 MG tablet Commonly known as:  TYLENOL Take 325 mg by mouth every 6 (six) hours as needed for mild pain or headache.   calcium carbonate 500 MG chewable tablet Commonly known as:  TUMS - dosed in mg elemental calcium Chew 2 tablets by mouth daily as needed for indigestion or heartburn.  prenatal multivitamin Tabs tablet Take 1 tablet by mouth daily at 12 noon.       Diet: routine diet  Activity: Advance as tolerated. Pelvic rest for 6 weeks.   Outpatient follow up:6 weeks Follow up Appt:No future appointments. Follow up Visit:No Follow-up on file.  Postpartum contraception: Will discuss with Dr. Charlotta Newtonzan at 6 weeks or prn.  Newborn Data: Live born female  Birth Weight: 6 lb 10.7 oz (3025 g) APGAR: 8, 9  Newborn Delivery   Birth date/time:  12/17/2016 17:13:00 Delivery type:  Vaginal, Spontaneous Delivery      Baby Feeding: Breast Disposition:home with mother   12/19/2016 Tamara BridgemanLATHAM, Jaidin [name], [name]

## 2016-12-21 ENCOUNTER — Inpatient Hospital Stay (HOSPITAL_COMMUNITY)
Admission: AD | Admit: 2016-12-21 | Payer: Managed Care, Other (non HMO) | Source: Ambulatory Visit | Admitting: Obstetrics & Gynecology

## 2017-04-24 DIAGNOSIS — J0100 Acute maxillary sinusitis, unspecified: Secondary | ICD-10-CM | POA: Diagnosis not present

## 2018-01-11 DIAGNOSIS — D225 Melanocytic nevi of trunk: Secondary | ICD-10-CM | POA: Diagnosis not present

## 2018-01-11 DIAGNOSIS — D485 Neoplasm of uncertain behavior of skin: Secondary | ICD-10-CM | POA: Diagnosis not present

## 2018-03-31 DIAGNOSIS — Z01419 Encounter for gynecological examination (general) (routine) without abnormal findings: Secondary | ICD-10-CM | POA: Diagnosis not present

## 2018-05-31 DIAGNOSIS — R509 Fever, unspecified: Secondary | ICD-10-CM | POA: Diagnosis not present

## 2018-10-14 IMAGING — US US OB COMP LESS 14 WK
1 series · 15 of 28 positions shown · non-contrast
Comparison: None.

CLINICAL DATA: Vaginal spotting

EXAM:
OBSTETRIC <14 WK US AND TRANSVAGINAL OB US
TECHNIQUE: Both transabdominal and transvaginal ultrasound examinations were
performed for complete evaluation of the gestation as well as the
maternal uterus, adnexal regions, and pelvic cul-de-sac.
Transvaginal technique was performed to assess early pregnancy.

[Series 1: us ob comp less 14 wk · 15 of 34 slices shown]
[im 1/34]
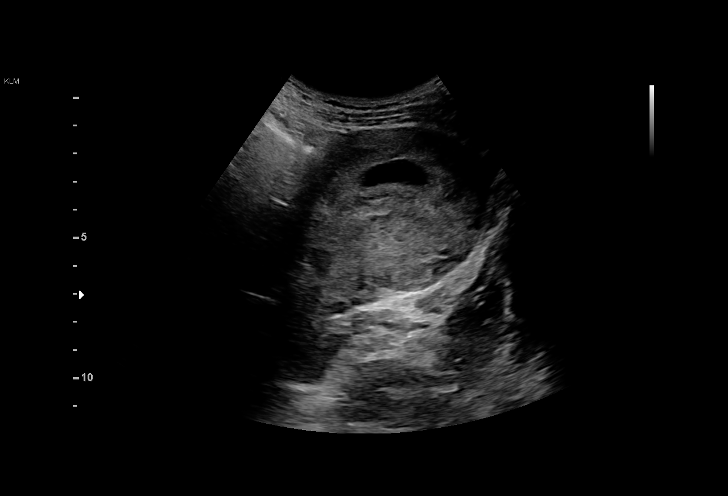
[im 3/34]
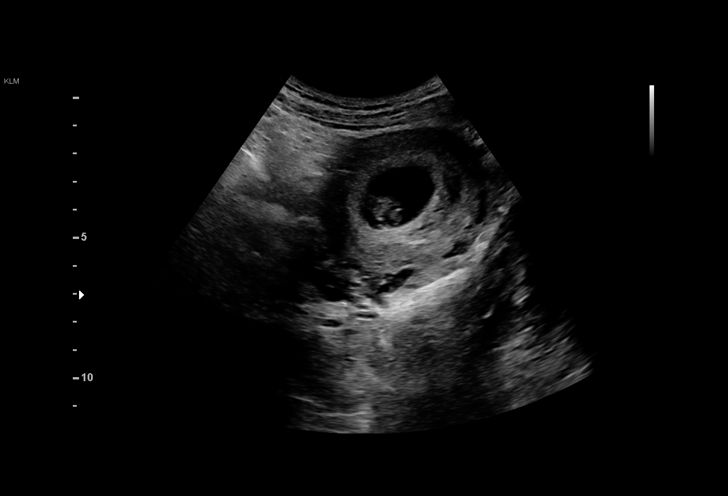
[im 5/34]
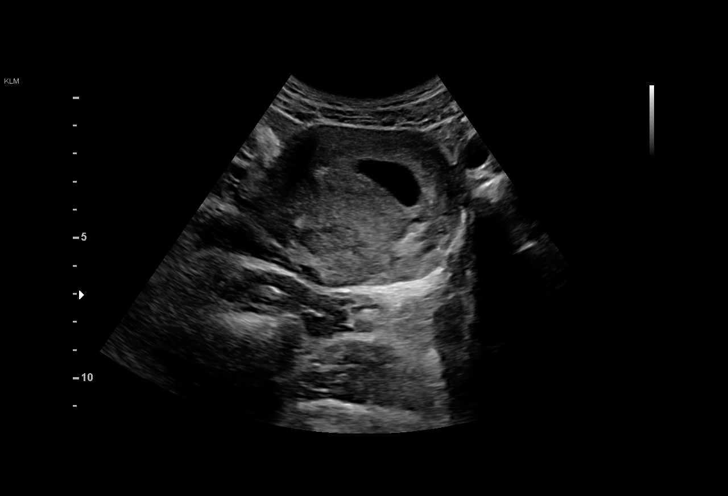
[im 8/34]
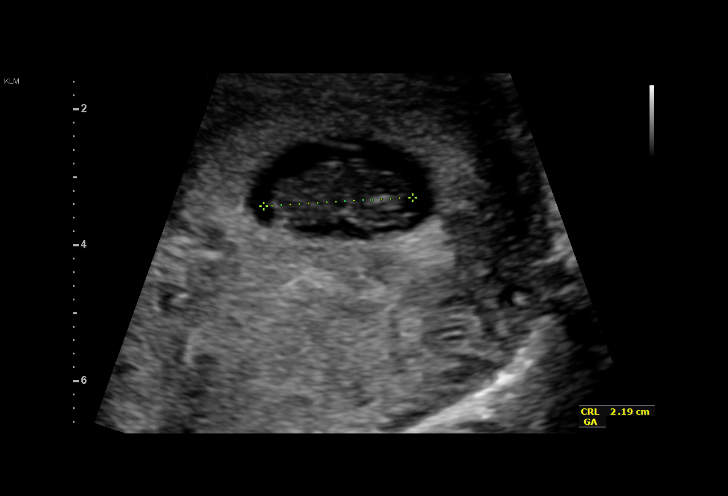
[im 10/34]
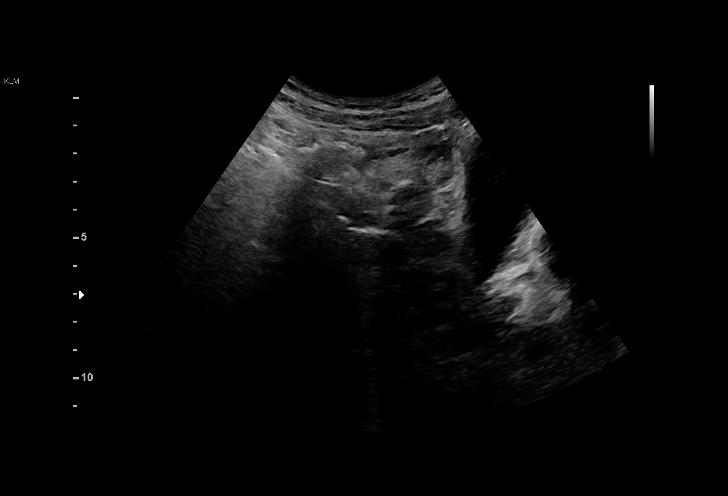
[im 13/34]
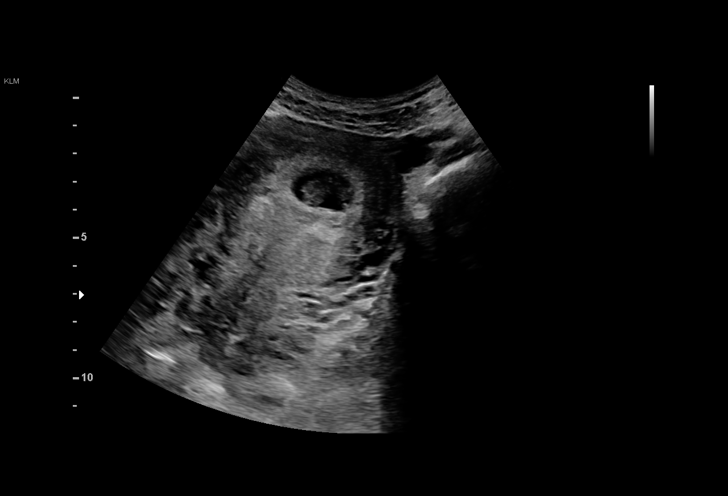
[im 15/34]
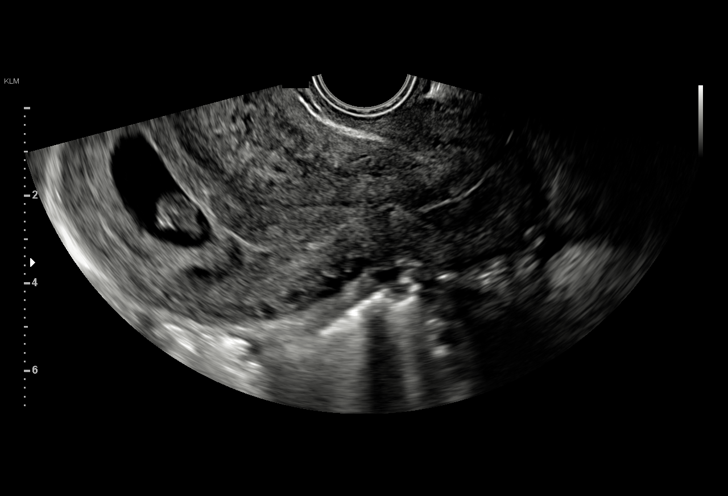
[im 18/34]
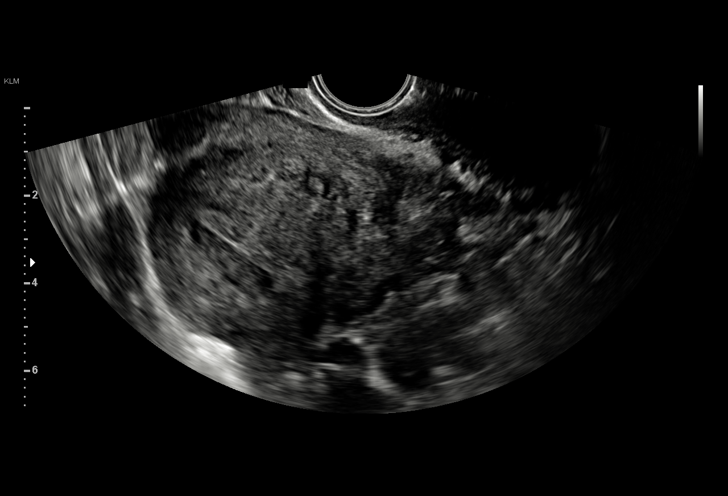
[im 19/34]
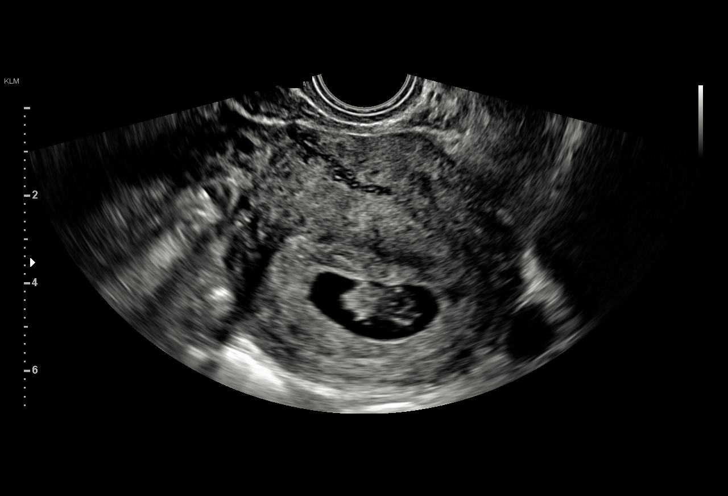
[im 21/34]
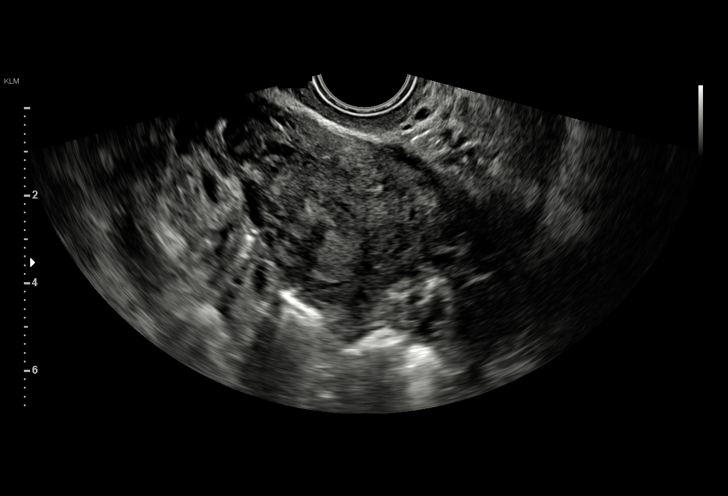
[im 24/34]
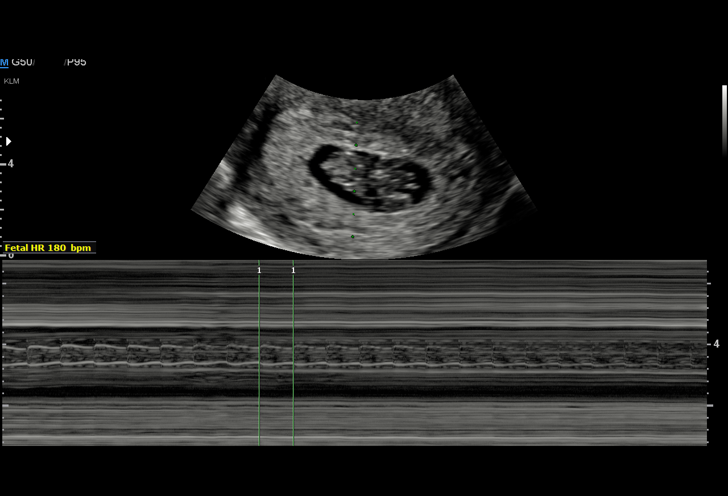
[im 26/34]
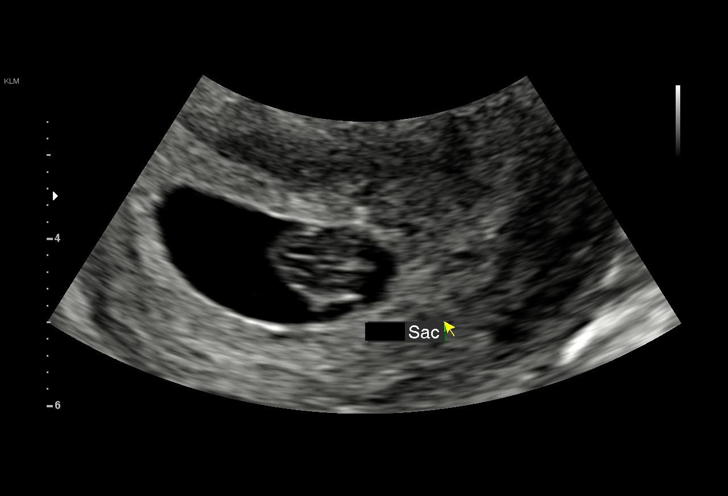
[im 29/34]
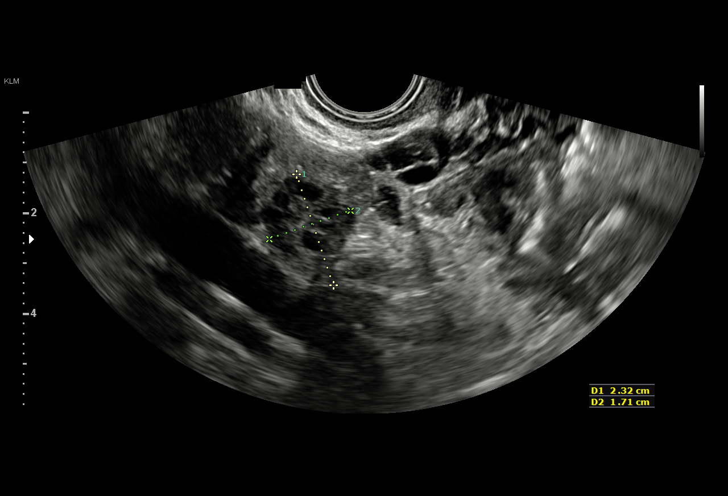
[im 31/34]
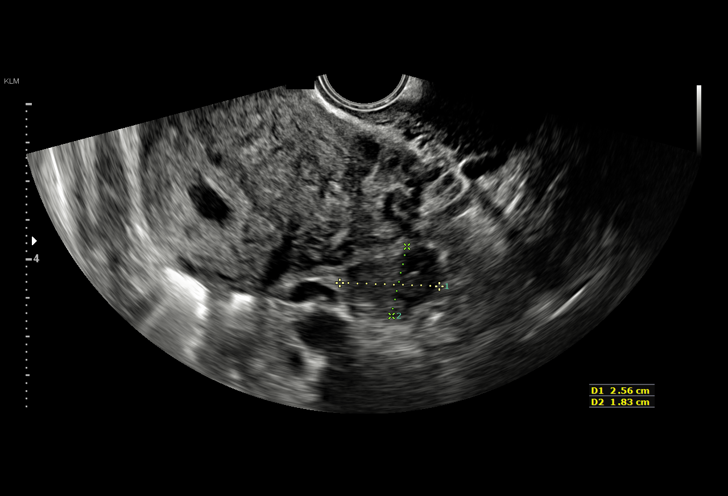
[im 34/34]
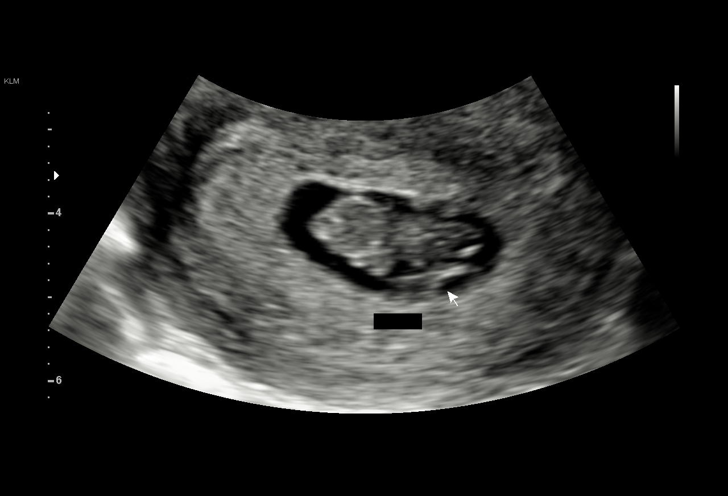

[15 of 28 positions shown; findings below may reference images not displayed]

FINDINGS: Intrauterine gestational sac: Single intrauterine gestation

Yolk sac:  Visualized

Embryo:  Visualized

Cardiac Activity: Visualized

Heart Rate: 178  bpm

CRL:  21.9  mm   8 w   5 d                  US EDC: 12/27/2016

Subchorionic hemorrhage:  None visualized.

Maternal uterus/adnexae: Ovaries are within normal limits. The right
ovary measures 2.3 x 1.7 x 2.3 cm. The left ovary measures 2.6 x
x 1.7 cm. No free fluid.
IMPRESSION: Single viable intrauterine gestation as above. There are no other
abnormalities visualized.

## 2019-01-03 DIAGNOSIS — D235 Other benign neoplasm of skin of trunk: Secondary | ICD-10-CM | POA: Diagnosis not present

## 2019-01-03 DIAGNOSIS — D2271 Melanocytic nevi of right lower limb, including hip: Secondary | ICD-10-CM | POA: Diagnosis not present

## 2019-01-03 DIAGNOSIS — D225 Melanocytic nevi of trunk: Secondary | ICD-10-CM | POA: Diagnosis not present

## 2019-01-03 DIAGNOSIS — D2272 Melanocytic nevi of left lower limb, including hip: Secondary | ICD-10-CM | POA: Diagnosis not present

## 2019-02-14 DIAGNOSIS — N644 Mastodynia: Secondary | ICD-10-CM | POA: Diagnosis not present

## 2019-02-20 ENCOUNTER — Other Ambulatory Visit: Payer: Self-pay | Admitting: Obstetrics & Gynecology

## 2019-02-20 DIAGNOSIS — N644 Mastodynia: Secondary | ICD-10-CM

## 2019-03-07 ENCOUNTER — Ambulatory Visit
Admission: RE | Admit: 2019-03-07 | Discharge: 2019-03-07 | Disposition: A | Discharge status: Home / Self Care | Payer: BC Managed Care – PPO | Source: Ambulatory Visit | Attending: Obstetrics & Gynecology | Admitting: Obstetrics & Gynecology

## 2019-03-07 ENCOUNTER — Other Ambulatory Visit: Payer: Self-pay

## 2019-03-07 DIAGNOSIS — N6489 Other specified disorders of breast: Secondary | ICD-10-CM | POA: Diagnosis not present

## 2019-03-07 DIAGNOSIS — N644 Mastodynia: Secondary | ICD-10-CM

## 2020-01-09 DIAGNOSIS — D2271 Melanocytic nevi of right lower limb, including hip: Secondary | ICD-10-CM | POA: Diagnosis not present

## 2020-01-09 DIAGNOSIS — D2272 Melanocytic nevi of left lower limb, including hip: Secondary | ICD-10-CM | POA: Diagnosis not present

## 2020-01-09 DIAGNOSIS — D225 Melanocytic nevi of trunk: Secondary | ICD-10-CM | POA: Diagnosis not present

## 2020-01-09 DIAGNOSIS — L910 Hypertrophic scar: Secondary | ICD-10-CM | POA: Diagnosis not present

## 2020-09-18 DIAGNOSIS — J0100 Acute maxillary sinusitis, unspecified: Secondary | ICD-10-CM | POA: Diagnosis not present

## 2021-03-07 DIAGNOSIS — Z0000 Encounter for general adult medical examination without abnormal findings: Secondary | ICD-10-CM | POA: Diagnosis not present

## 2021-03-07 DIAGNOSIS — E559 Vitamin D deficiency, unspecified: Secondary | ICD-10-CM | POA: Diagnosis not present

## 2021-03-07 DIAGNOSIS — Z13220 Encounter for screening for lipoid disorders: Secondary | ICD-10-CM | POA: Diagnosis not present

## 2021-04-08 DIAGNOSIS — J0100 Acute maxillary sinusitis, unspecified: Secondary | ICD-10-CM | POA: Diagnosis not present

## 2021-05-13 DIAGNOSIS — D225 Melanocytic nevi of trunk: Secondary | ICD-10-CM | POA: Diagnosis not present

## 2021-05-13 DIAGNOSIS — D2261 Melanocytic nevi of right upper limb, including shoulder: Secondary | ICD-10-CM | POA: Diagnosis not present

## 2021-05-13 DIAGNOSIS — D224 Melanocytic nevi of scalp and neck: Secondary | ICD-10-CM | POA: Diagnosis not present

## 2021-05-13 DIAGNOSIS — D2262 Melanocytic nevi of left upper limb, including shoulder: Secondary | ICD-10-CM | POA: Diagnosis not present

## 2021-07-29 IMAGING — US US BREAST*L* LIMITED INC AXILLA
1 series · 2 of 2 positions shown · non-contrast
Comparison: None.

CLINICAL DATA: 28-year-old female with a palpable area of concern
in the far upper outer left breast/axillary tail region with mild
associated tenderness.

EXAM:
ULTRASOUND OF THE LEFT BREAST

[Series 1: us breast*left* limited inc axilla · 0.05mm/px · 2 of 2 slices shown]
[im 1/2]
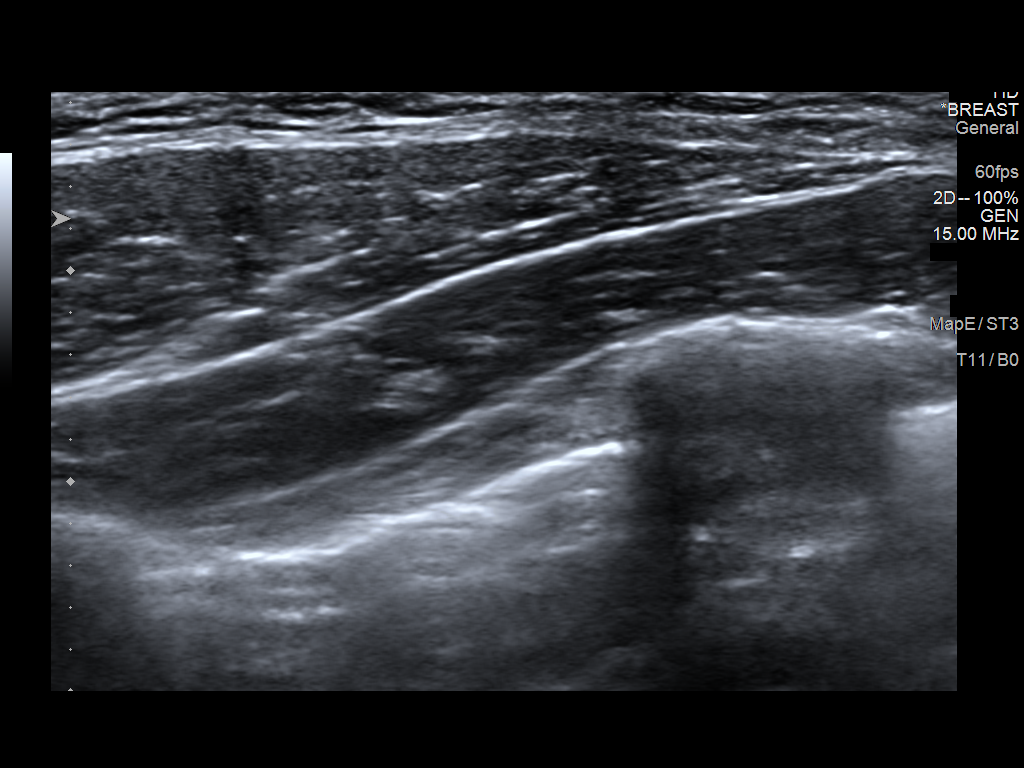
[im 2/2]
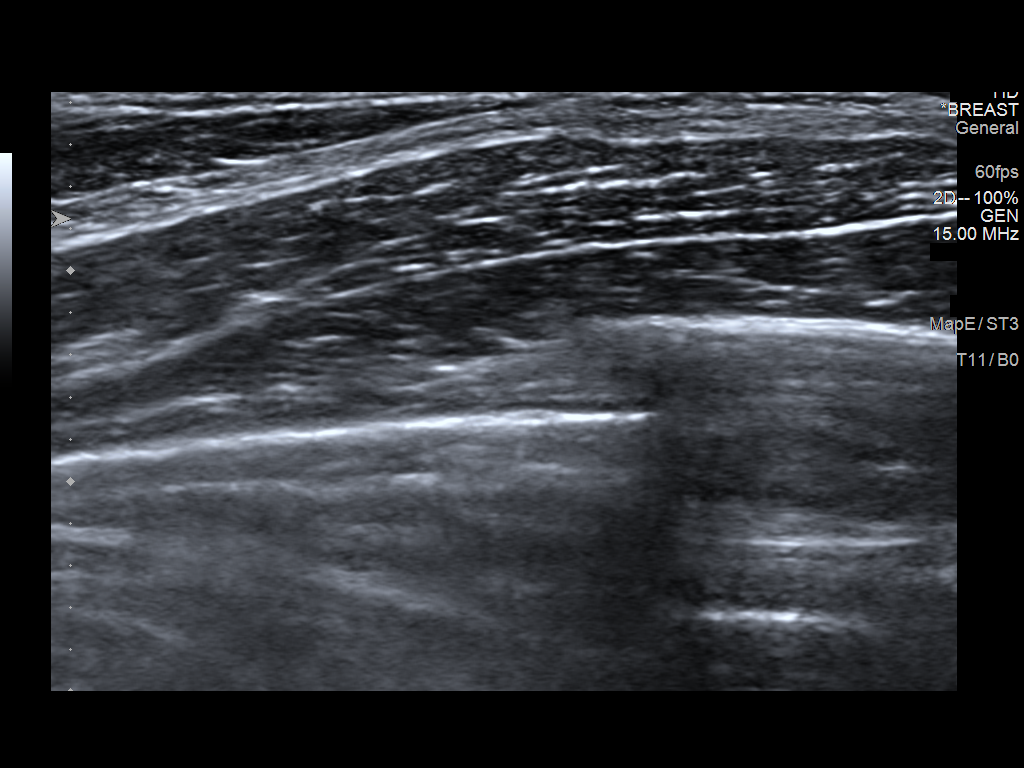

[2 of 2 positions shown; findings below may reference images not displayed]

FINDINGS: Physical examination in the region of palpable concern in the far
upper outer left breast/axillary tail does not reveal any palpable
masses.

Targeted ultrasound of the left breast was performed. No suspicious
masses or abnormality seen, only heterogeneous fibroglandular tissue
identified.
IMPRESSION: No sonographic abnormality seen at the site of palpable concern with
associated tenderness in the far upper outer left breast/axillary
tail.

RECOMMENDATION:
1. Recommend further management of left breast palpable area of
concern be based on clinical assessment.

2. Screening mammogram at age 39 unless there are persistent or
intervening clinical concerns. (Code:[id number]-[id number])

I have discussed the findings and recommendations with the patient.
If applicable, a reminder letter will be sent to the patient
regarding the next appointment.

BI-RADS CATEGORY  1: Negative.

## 2021-12-23 DIAGNOSIS — H8110 Benign paroxysmal vertigo, unspecified ear: Secondary | ICD-10-CM | POA: Diagnosis not present

## 2021-12-23 DIAGNOSIS — R42 Dizziness and giddiness: Secondary | ICD-10-CM | POA: Diagnosis not present

## 2021-12-23 DIAGNOSIS — R110 Nausea: Secondary | ICD-10-CM | POA: Diagnosis not present

## 2021-12-30 DIAGNOSIS — R42 Dizziness and giddiness: Secondary | ICD-10-CM | POA: Diagnosis not present
